# Patient Record
Sex: Female | Born: 1985 | Race: White | Hispanic: No | Marital: Married | Smoking: Never smoker
Health system: Southern US, Community
[De-identification: ages and names within clinical notes are randomized; demographics above are authoritative.]

## PROBLEM LIST (undated history)

## (undated) DIAGNOSIS — F419 Anxiety disorder, unspecified: Secondary | ICD-10-CM

## (undated) DIAGNOSIS — F99 Mental disorder, not otherwise specified: Secondary | ICD-10-CM

## (undated) DIAGNOSIS — R000 Tachycardia, unspecified: Secondary | ICD-10-CM

## (undated) DIAGNOSIS — K219 Gastro-esophageal reflux disease without esophagitis: Secondary | ICD-10-CM

## (undated) DIAGNOSIS — F410 Panic disorder [episodic paroxysmal anxiety] without agoraphobia: Secondary | ICD-10-CM

## (undated) HISTORY — DX: Panic disorder (episodic paroxysmal anxiety): F41.0

## (undated) HISTORY — DX: Mental disorder, not otherwise specified: F99

## (undated) HISTORY — DX: Tachycardia, unspecified: R00.0

## (undated) HISTORY — DX: Gastro-esophageal reflux disease without esophagitis: K21.9

## (undated) HISTORY — DX: Anxiety disorder, unspecified: F41.9

---
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
## 2005-12-25 ENCOUNTER — Other Ambulatory Visit: Admission: RE | Admit: 2005-12-25 | Discharge: 2005-12-25 | Payer: Self-pay | Admitting: Obstetrics and Gynecology

## 2006-04-15 ENCOUNTER — Emergency Department (HOSPITAL_COMMUNITY): Admission: EM | Admit: 2006-04-15 | Discharge: 2006-04-15 | Payer: Self-pay | Admitting: Emergency Medicine

## 2007-01-16 ENCOUNTER — Emergency Department (HOSPITAL_COMMUNITY): Admission: EM | Admit: 2007-01-16 | Discharge: 2007-01-16 | Payer: Self-pay | Admitting: Emergency Medicine

## 2007-06-06 IMAGING — CR DG CHEST 2V
2 series · 2 of 2 positions shown · non-contrast
Comparison: none

CLINICAL DATA: Chest pain and tachycardia.
 CHEST - 2 VIEWS ? 04/15/06:

[w chest pa]
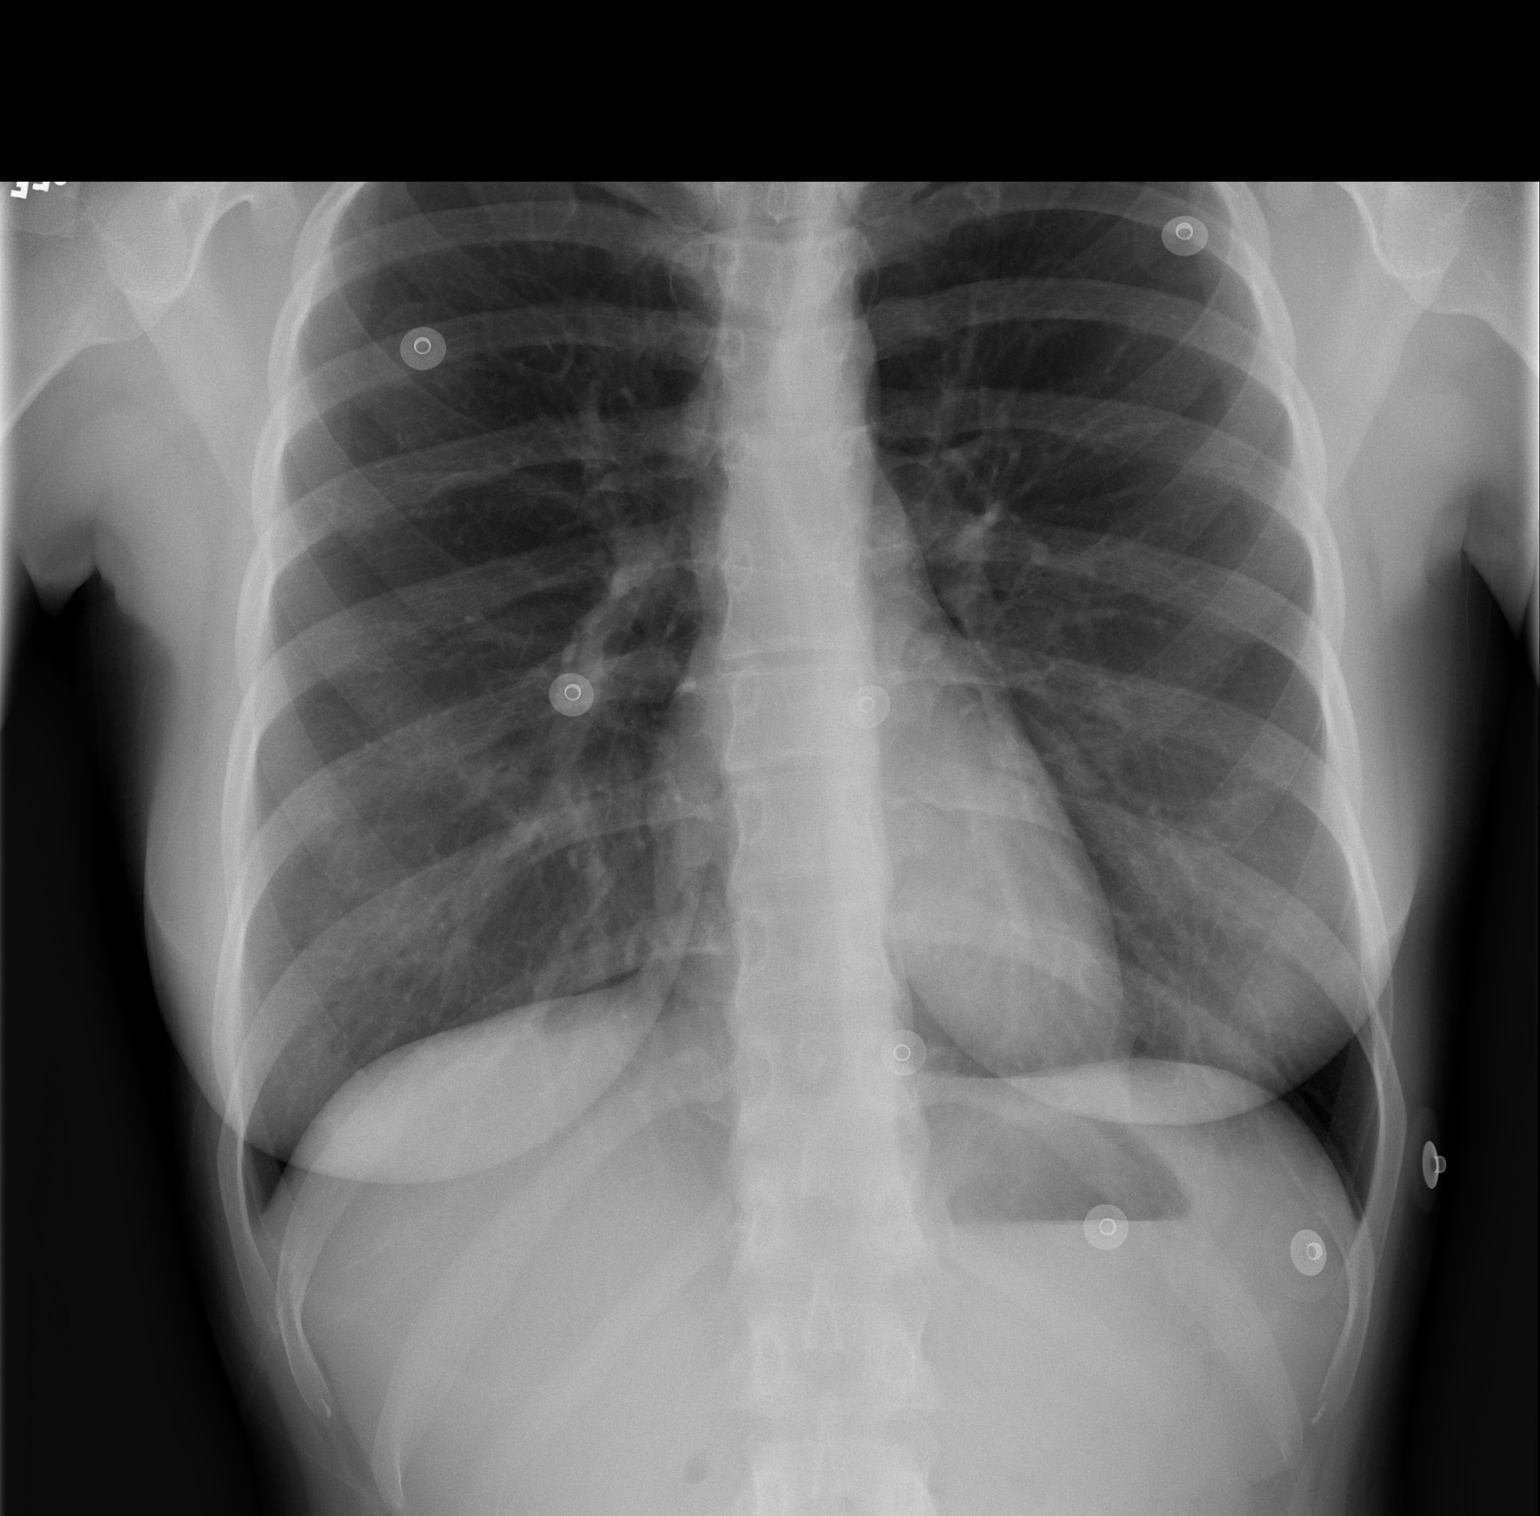

[w chest lat]
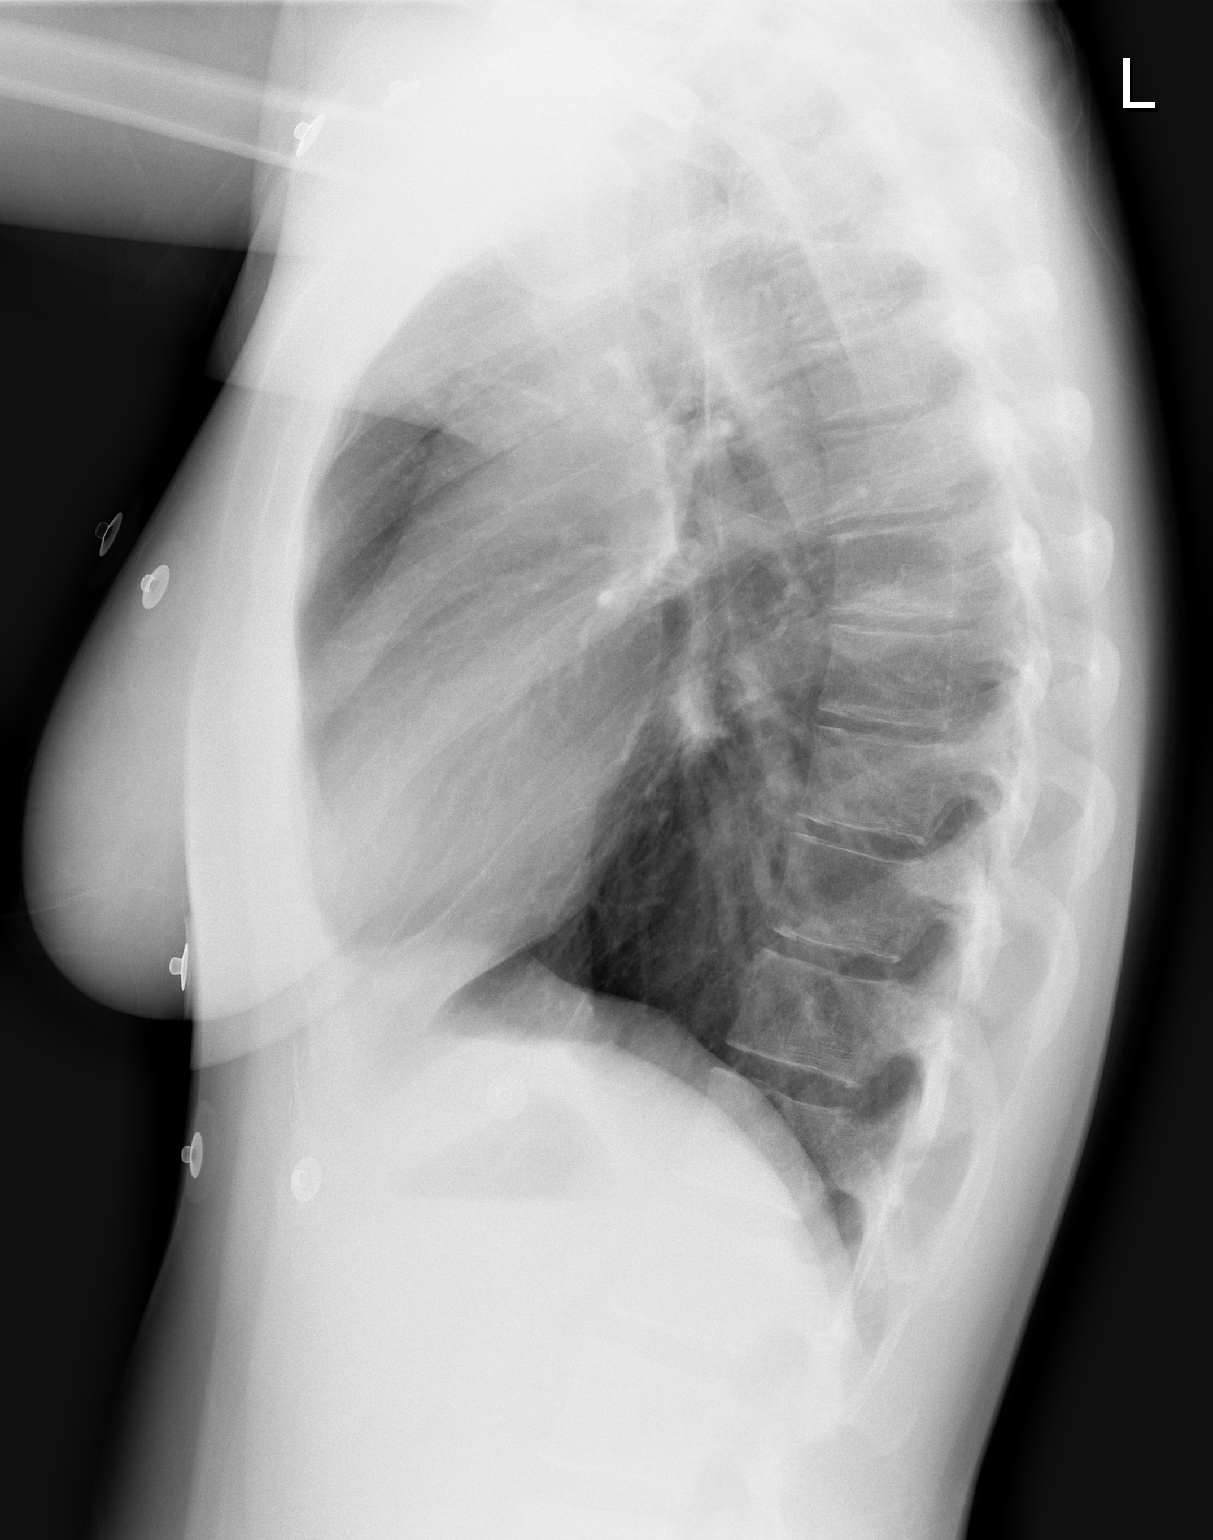

[2 of 2 positions shown; findings below may reference images not displayed]

FINDINGS: The heart size and mediastinal contours are within normal limits.  Both lungs are clear.  The visualized skeletal structures are within normal limits.
IMPRESSION: No active cardiopulmonary disease.

## 2007-07-05 ENCOUNTER — Emergency Department (HOSPITAL_COMMUNITY): Admission: EM | Admit: 2007-07-05 | Discharge: 2007-07-05 | Payer: Self-pay | Admitting: Emergency Medicine

## 2007-11-19 ENCOUNTER — Encounter: Admission: RE | Admit: 2007-11-19 | Discharge: 2007-11-19 | Payer: Self-pay | Admitting: Family Medicine

## 2008-03-08 IMAGING — CR DG CHEST 1V PORT
1 series · 1 of 1 positions shown · non-contrast
Comparison: 04/15/06.

CLINICAL DATA: 20-year-old with fever and throat closing.
 PORTABLE CHEST ? 1 VIEW ? 01/16/07:

[view not recorded]
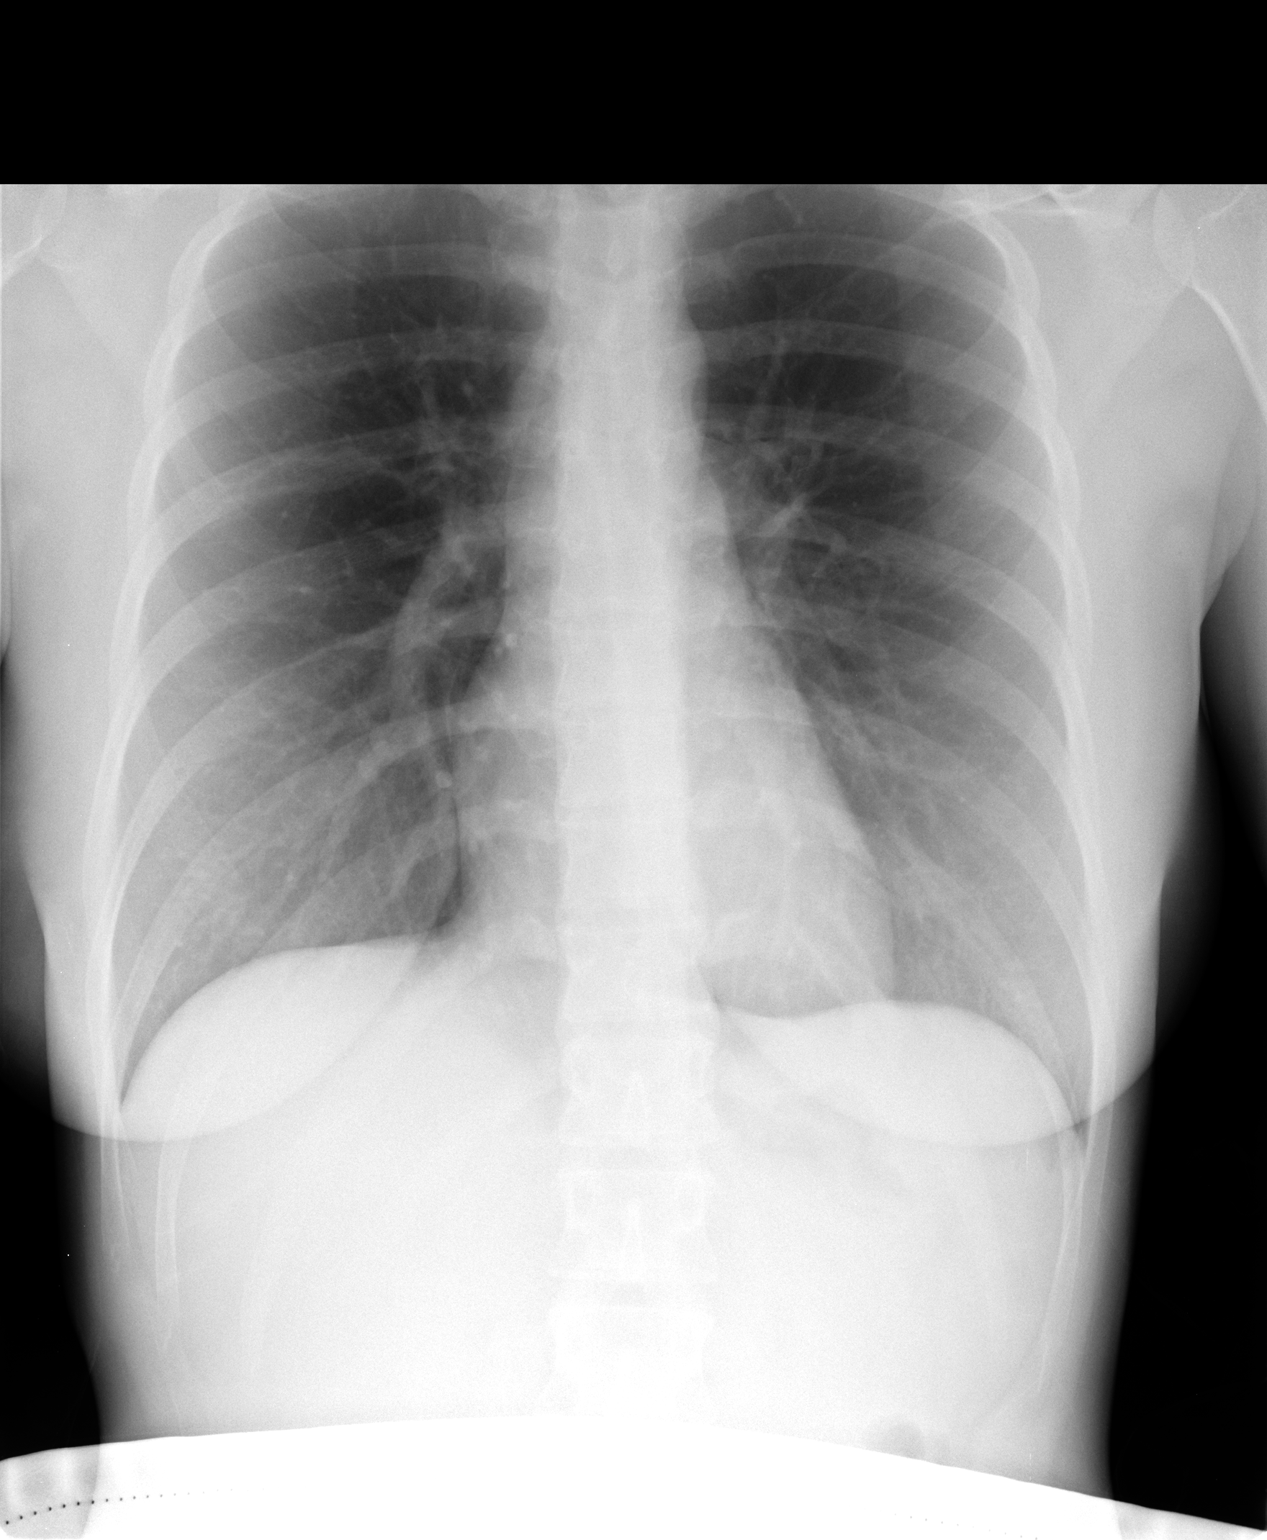

[1 of 1 positions shown; findings below may reference images not displayed]

FINDINGS: Single view of the chest demonstrates clear lungs.  Heart and mediastinum are stable.  Trachea is midline.  No acute bone abnormalities.
IMPRESSION: No acute chest findings.

## 2009-01-09 IMAGING — US US SOFT TISSUE HEAD/NECK
1 series · 14 of 25 positions shown · non-contrast
Comparison: Portable chest x-ray 01/16/07 [REDACTED].

CLINICAL DATA: Thyromegaly on physical exam. 
 THYROID ULTRASOUND:
TECHNIQUE: Ultrasound examination of the thyroid gland and adjacent soft tissue structures was performed.

[Series 1: us soft tissue head/neck · 0.07mm/px · 14 of 29 slices shown]
[im 1/29]
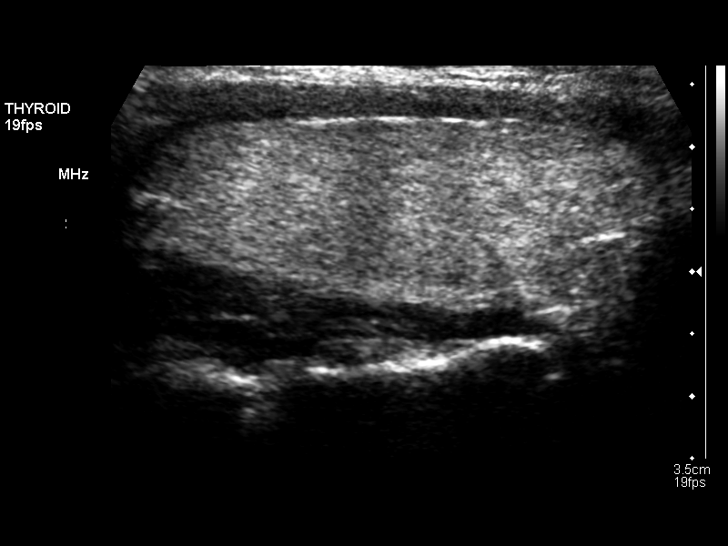
[im 3/29]
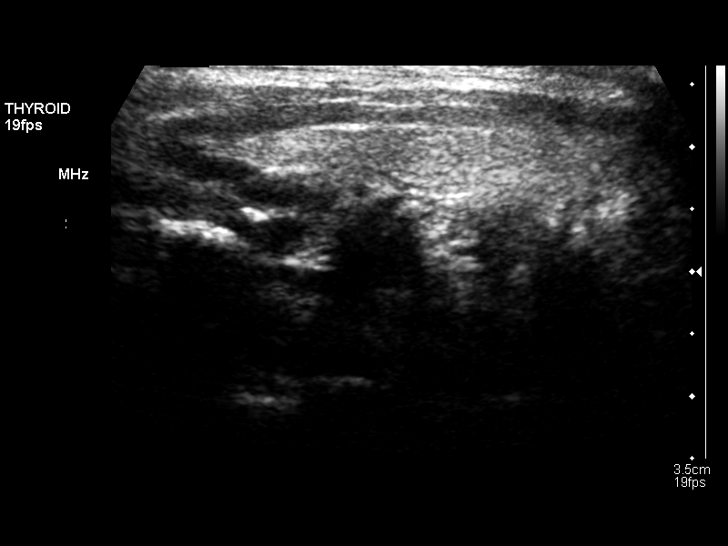
[im 5/29]
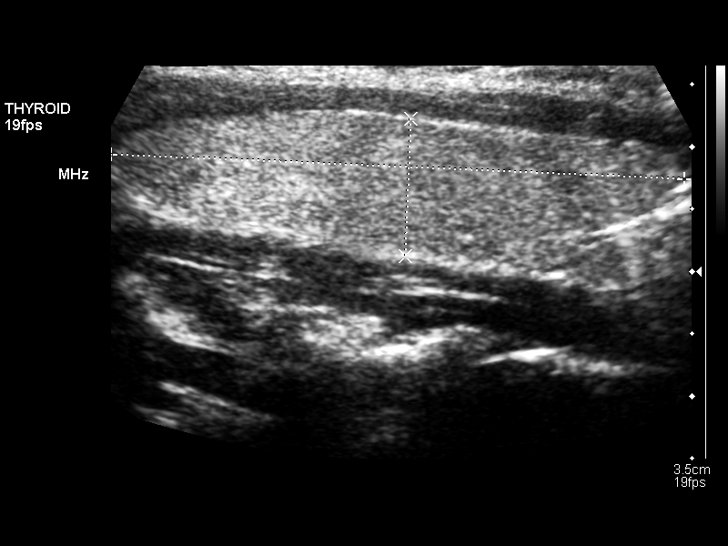
[im 8/29]
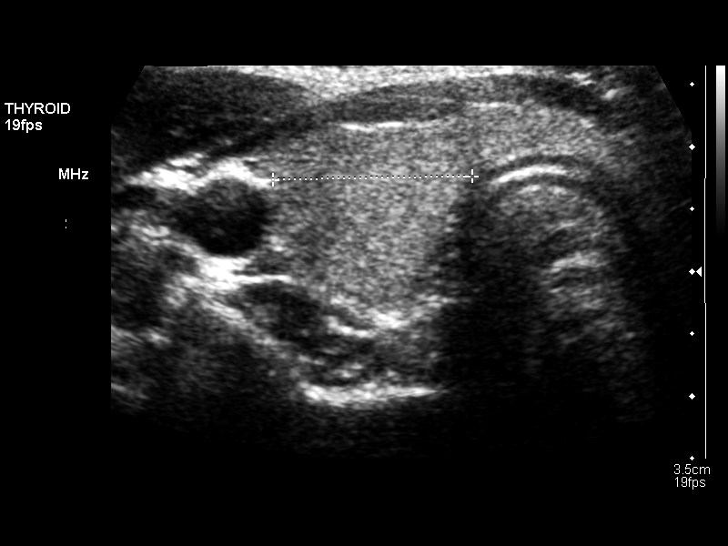
[im 10/29]
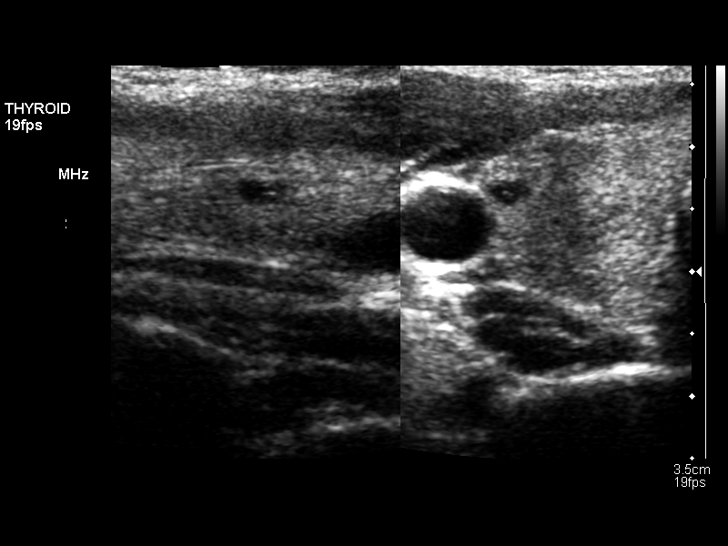
[im 11/29]
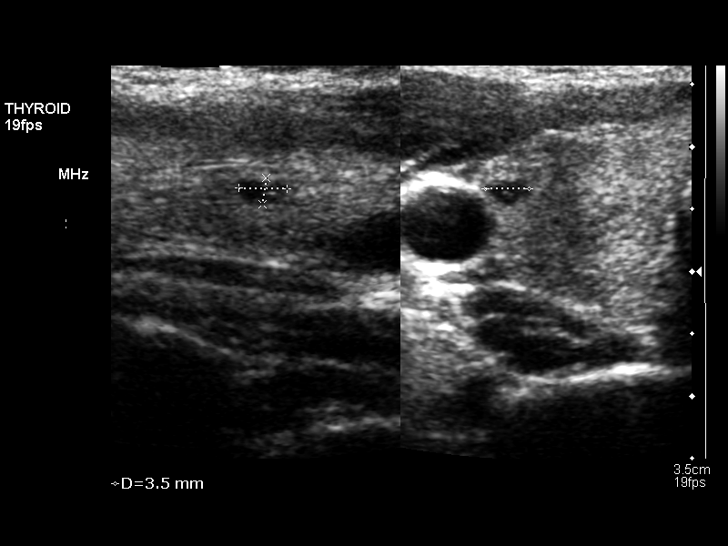
[im 13/29]
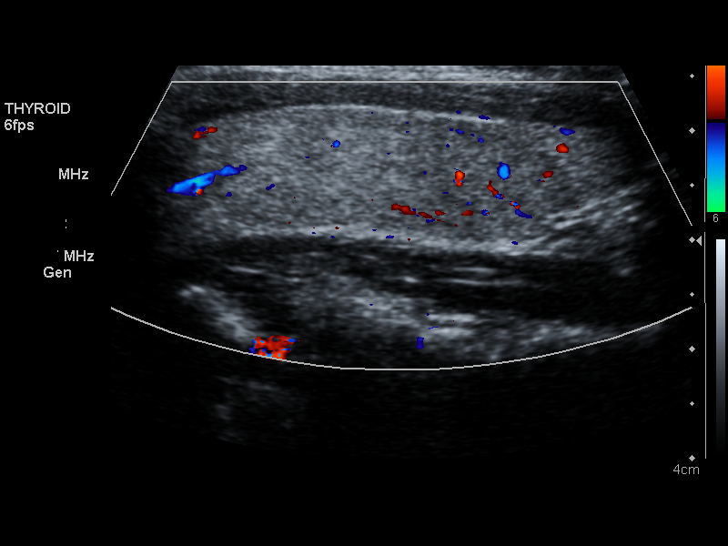
[im 16/29]
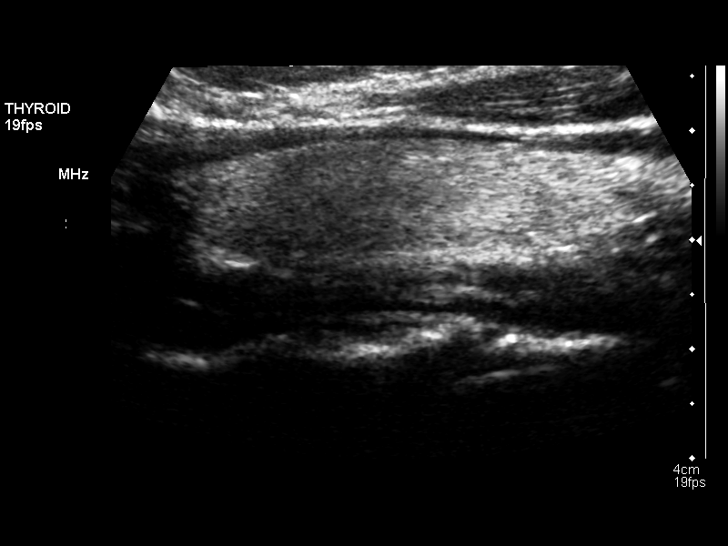
[im 18/29]
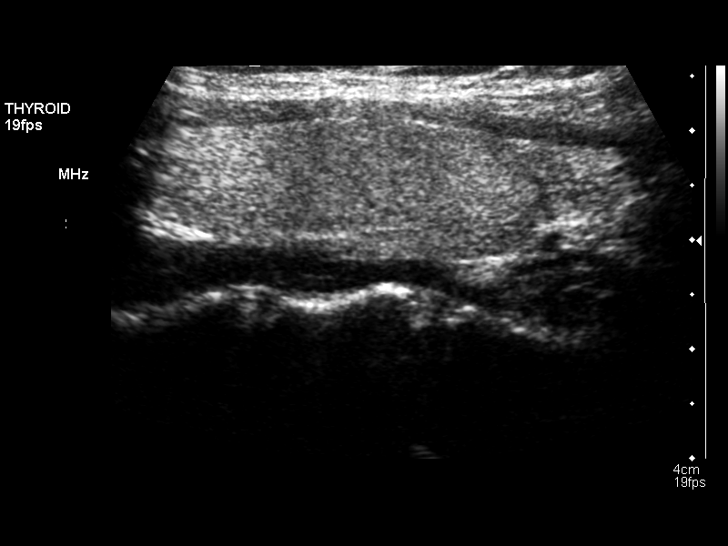
[im 19/29]
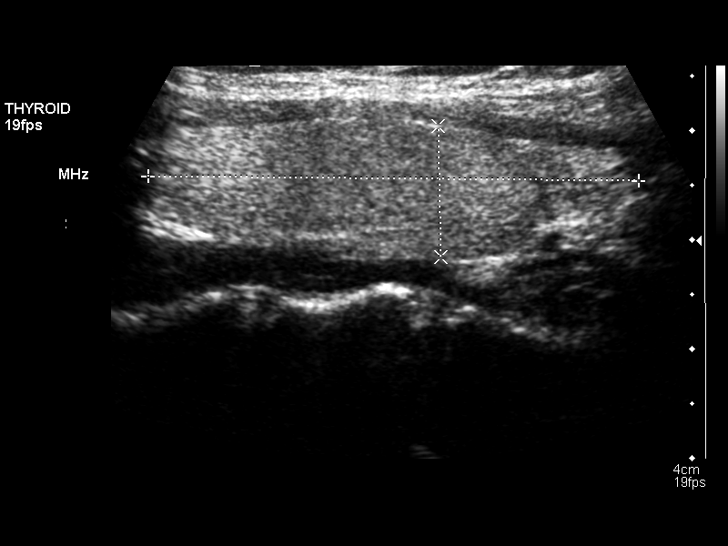
[im 22/29]
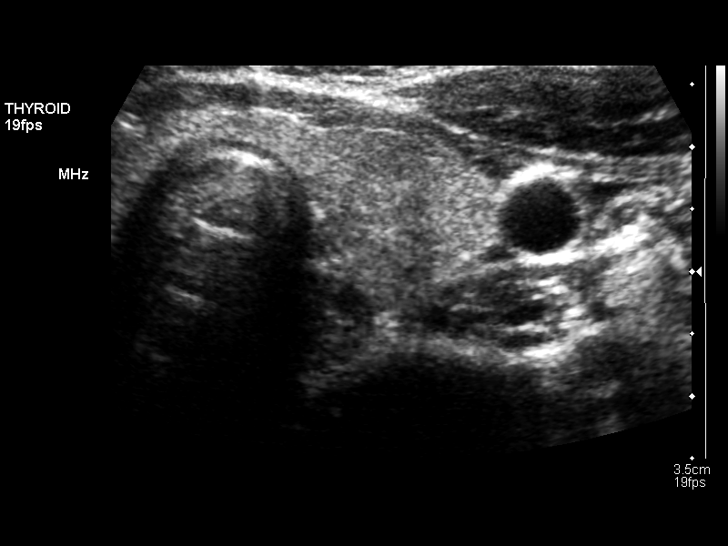
[im 24/29]
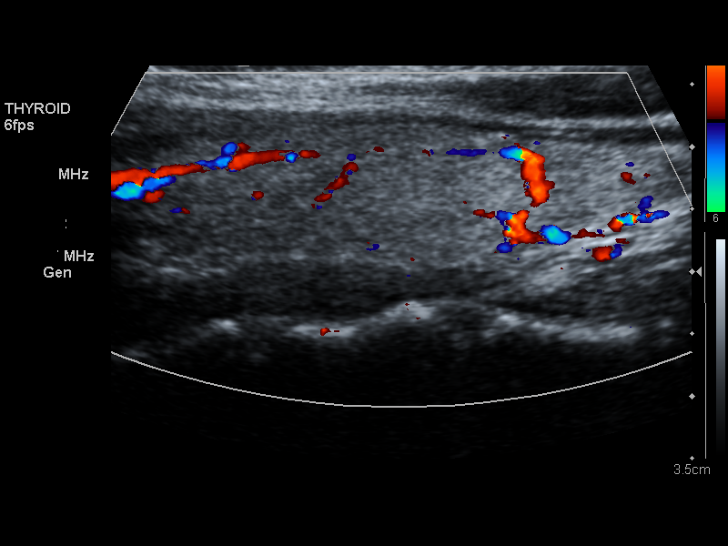
[im 26/29]
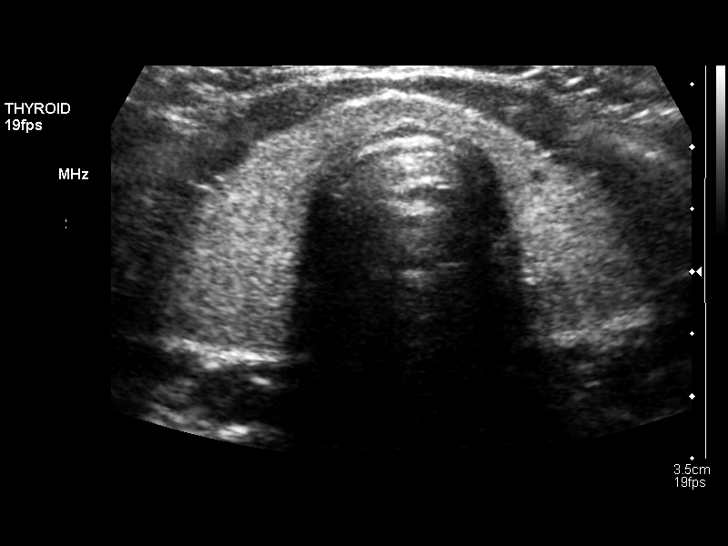
[im 29/29]
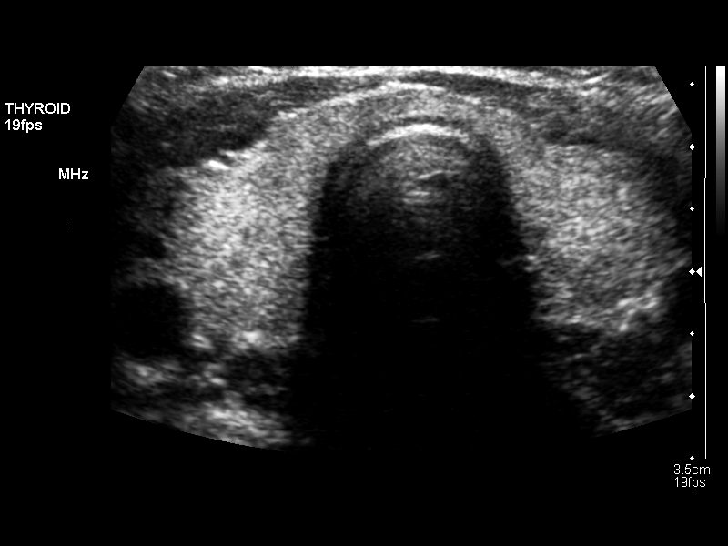

[14 of 25 positions shown; findings below may reference images not displayed]

FINDINGS: Thyroid gland is upper limits of normal in size with the right lobe measuring 4.6cm long x 1.1cm AP x 1.6cm wide and left lobe 4.8cm long x 1.4cm AP x 1.5cm wide.  The isthmus measures normally at 2mm AP thickness.  Thyroid echotexture is diffusely homogeneous.  At the lateral mid right lobe is solitary hypoechoic marginated nodule measuring 4mm long x 2mm AP x 4mm wide.
IMPRESSION: 1.  Likely benign solitary hypoechoic nodule such as adenoma lateral mid right lobe. 
 2.  Otherwise negative.

## 2010-12-22 ENCOUNTER — Encounter: Payer: Self-pay | Admitting: Family Medicine

## 2011-09-15 LAB — I-STAT 8, (EC8 V) (CONVERTED LAB)
BUN: 6
Chloride: 106
HCT: 44
Operator id: 272551
Potassium: 4
Sodium: 140
TCO2: 26
pH, Ven: 7.462 — ABNORMAL HIGH

## 2011-09-15 LAB — CBC
HCT: 40.8
Hemoglobin: 13.9
MCV: 87.9
RDW: 13
WBC: 8.3

## 2011-09-15 LAB — POCT CARDIAC MARKERS
Myoglobin, poc: 18
Operator id: 272551
Troponin i, poc: <0.05

## 2011-09-15 LAB — MAGNESIUM: Magnesium: 2

## 2011-09-15 LAB — DIFFERENTIAL
Eosinophils Absolute: 0
Monocytes Absolute: 0.7
Neutrophils Relative %: 71

## 2020-11-13 ENCOUNTER — Ambulatory Visit (INDEPENDENT_AMBULATORY_CARE_PROVIDER_SITE_OTHER): Payer: BC Managed Care – PPO | Admitting: Women's Health

## 2020-11-13 ENCOUNTER — Encounter: Payer: Self-pay | Admitting: Women's Health

## 2020-11-13 ENCOUNTER — Other Ambulatory Visit: Payer: Self-pay

## 2020-11-13 VITALS — BP 119/72 | HR 80 | Ht 65.0 in | Wt 183.8 lb

## 2020-11-13 DIAGNOSIS — Z30430 Encounter for insertion of intrauterine contraceptive device: Secondary | ICD-10-CM | POA: Insufficient documentation

## 2020-11-13 DIAGNOSIS — Z30432 Encounter for removal of intrauterine contraceptive device: Secondary | ICD-10-CM

## 2020-11-13 MED ORDER — LEVONORGESTREL 20 MCG/24HR IU IUD: INTRAUTERINE_SYSTEM | Freq: Once | INTRAUTERINE | Status: AC

## 2020-11-13 NOTE — Progress Notes (Signed)
   IUD REMOVAL & RE-INSERTION Patient name: Kerry Austin MRN [id number]  Date of birth: 1986/04/07 Subjective Findings:   @[place]  N Salak is a 34 y.o. G28P2002 Caucasian female being seen today for removal of a Mirena  IUD and insertion of a Mirena  IUD. Her IUD was placed Feb 2016 in another office.  This will be her 3rd Mirena.   Depression screen [facility] 2/9 11/13/2020  Decreased Interest 0  Down, Depressed, Hopeless 0  PHQ - 2 Score 0  Altered sleeping 1  Tired, decreased energy 0  Change in appetite 0  Feeling bad or failure about yourself  0  Trouble concentrating 0  Moving slowly or fidgety/restless 0  Suicidal thoughts 0  PHQ-9 Score 1    Patient's last menstrual period was 10/29/2020 (approximate). Last pap Nov 2020. Results were:  normal  The risks and benefits of the method and placement have been thouroughly reviewed with the patient and all questions were answered.  Specifically the patient is aware of failure rate of 12/998, expulsion of the IUD and of possible perforation.  The patient is aware of irregular bleeding due to the method and understands the incidence of irregular bleeding diminishes with time.  Signed copy of informed consent in chart.  Pertinent History Reviewed:   Reviewed past medical,surgical, social, obstetrical and family history.  Reviewed problem list, medications and allergies. Objective Findings & Procedure:    Vitals:   11/13/20 0834  BP: 119/72  Pulse: 80  Weight: 183 lb 12.8 oz (83.4 kg)  Height: 5\' 5"  (1.651 m)  Body mass index is 30.59 kg/m.  No results found for this or any previous visit (from the past 24 hour(s)).   Time out was performed.  A graves speculum was placed in the vagina.  The cervix was visualized, prepped using Betadine. The strings were visible. They were grasped and the Mirena IUD was easily removed. The cervix was then grasped with a single-tooth tenaculum. The uterus was found to be neutral and it sounded to 8 cm.   Mirena  IUD placed per manufacturer's recommendations without complications. The strings were trimmed to approximately 3 cm.  The patient tolerated the procedure well.   Informal transvaginal sonogram was performed and the proper placement of the IUD was verified.   Chaperone: 11/15/20 & Plan:   1) Mirena IUD removal & Mirena insertion The patient was given post procedure instructions, including signs and symptoms of infection and to check for the strings after each menses or each month, and refraining from intercourse or anything in the vagina for 3 days. She was given a care card with date IUD placed, and date IUD to be removed. She is scheduled for a f/u appointment in 4 weeks.  No orders of the defined types were placed in this encounter.   Follow-up: Return in about 4 weeks (around 12/11/2020) for IUD F/U, in person, CNM.  Art gallery manager CNM, [facility] 11/13/2020 9:13 AM

## 2020-11-13 NOTE — Patient Instructions (Signed)
 Nothing in vagina for 3 days (no sex, douching, tampons, etc...)  Check your strings once a month to make sure you can feel them, if you are not able to please let us know  If you develop a fever of 100.4 or more in the next few weeks, or if you develop severe abdominal pain, please let us know  Use a backup method of birth control, such as condoms, for 2 weeks     Intrauterine Device Insertion, Care After  This sheet gives you information about how to care for yourself after your procedure. Your health care provider may also give you more specific instructions. If you have problems or questions, contact your health care provider. What can I expect after the procedure? After the procedure, it is common to have:  Cramps and pain in the abdomen.  Light bleeding (spotting) or heavier bleeding that is like your menstrual period. This may last for up to a few days.  Lower back pain.  Dizziness.  Headaches.  Nausea. Follow these instructions at home:  Before resuming sexual activity, check to make sure that you can feel the IUD string(s). You should be able to feel the end of the string(s) below the opening of your cervix. If your IUD string is in place, you may resume sexual activity. ? If you had a hormonal IUD inserted more than 7 days after your most recent period started, you will need to use a backup method of birth control for 7 days after IUD insertion. Ask your health care provider whether this applies to you.  Continue to check that the IUD is still in place by feeling for the string(s) after every menstrual period, or once a month.  Take over-the-counter and prescription medicines only as told by your health care provider.  Do not drive or use heavy machinery while taking prescription pain medicine.  Keep all follow-up visits as told by your health care provider. This is important. Contact a health care provider if:  You have bleeding that is heavier or lasts longer  than a normal menstrual cycle.  You have a fever.  You have cramps or abdominal pain that get worse or do not get better with medicine.  You develop abdominal pain that is new or is not in the same area of earlier cramping and pain.  You feel lightheaded or weak.  You have abnormal or bad-smelling discharge from your vagina.  You have pain during sexual activity.  You have any of the following problems with your IUD string(s): ? The string bothers or hurts you or your sexual partner. ? You cannot feel the string. ? The string has gotten longer.  You can feel the IUD in your vagina.  You think you may be pregnant, or you miss your menstrual period.  You think you may have an STI (sexually transmitted infection). Get help right away if:  You have flu-like symptoms.  You have a fever and chills.  You can feel that your IUD has slipped out of place. Summary  After the procedure, it is common to have cramps and pain in the abdomen. It is also common to have light bleeding (spotting) or heavier bleeding that is like your menstrual period.  Continue to check that the IUD is still in place by feeling for the string(s) after every menstrual period, or once a month.  Keep all follow-up visits as told by your health care provider. This is important.  Contact your health care provider   if you have problems with your IUD string(s), such as the string getting longer or bothering you or your sexual partner. This information is not intended to replace advice given to you by your health care provider. Make sure you discuss any questions you have with your health care provider. Document Revised: 10/30/2017 Document Reviewed: 10/08/2016 Elsevier Patient Education  2020 Elsevier Inc.  

## 2020-11-22 DIAGNOSIS — U071 COVID-19: Secondary | ICD-10-CM | POA: Diagnosis not present

## 2020-12-06 DIAGNOSIS — U099 Post covid-19 condition, unspecified: Secondary | ICD-10-CM | POA: Diagnosis not present

## 2021-03-04 DIAGNOSIS — J301 Allergic rhinitis due to pollen: Secondary | ICD-10-CM | POA: Diagnosis not present

## 2021-03-04 DIAGNOSIS — K219 Gastro-esophageal reflux disease without esophagitis: Secondary | ICD-10-CM | POA: Diagnosis not present

## 2021-03-04 DIAGNOSIS — J019 Acute sinusitis, unspecified: Secondary | ICD-10-CM | POA: Diagnosis not present

## 2021-03-04 DIAGNOSIS — Z681 Body mass index (BMI) 19 or less, adult: Secondary | ICD-10-CM | POA: Diagnosis not present

## 2021-10-13 ENCOUNTER — Emergency Department (HOSPITAL_COMMUNITY)
Admission: EM | Admit: 2021-10-13 | Discharge: 2021-10-13 | Disposition: A | Discharge status: Home / Self Care | Payer: BC Managed Care – PPO | Attending: Emergency Medicine | Admitting: Emergency Medicine

## 2021-10-13 ENCOUNTER — Encounter (HOSPITAL_COMMUNITY): Payer: Self-pay

## 2021-10-13 ENCOUNTER — Other Ambulatory Visit: Payer: Self-pay

## 2021-10-13 DIAGNOSIS — T2020XA Burn of second degree of head, face, and neck, unspecified site, initial encounter: Secondary | ICD-10-CM | POA: Insufficient documentation

## 2021-10-13 DIAGNOSIS — S0501XA Injury of conjunctiva and corneal abrasion without foreign body, right eye, initial encounter: Secondary | ICD-10-CM | POA: Insufficient documentation

## 2021-10-13 DIAGNOSIS — T23261A Burn of second degree of back of right hand, initial encounter: Secondary | ICD-10-CM

## 2021-10-13 DIAGNOSIS — T23201A Burn of second degree of right hand, unspecified site, initial encounter: Secondary | ICD-10-CM | POA: Insufficient documentation

## 2021-10-13 DIAGNOSIS — X088XXA Exposure to other specified smoke, fire and flames, initial encounter: Secondary | ICD-10-CM | POA: Insufficient documentation

## 2021-10-13 MED ORDER — TETRACAINE HCL 0.5 % OP SOLN
1.0000 [drp] | Freq: Once | OPHTHALMIC | Status: AC
  Administered 2021-10-13: 1 [drp] via OPHTHALMIC
  Filled 2021-10-13: qty 4

## 2021-10-13 MED ORDER — ERYTHROMYCIN 5 MG/GM OP OINT
TOPICAL_OINTMENT | Freq: Once | OPHTHALMIC | Status: AC
  Filled 2021-10-13: qty 3.5

## 2021-10-13 MED ORDER — HYDROMORPHONE HCL 1 MG/ML IJ SOLN
1.0000 mg | Freq: Once | INTRAMUSCULAR | Status: AC
  Administered 2021-10-13: 1 mg via INTRAMUSCULAR
  Filled 2021-10-13: qty 1

## 2021-10-13 MED ORDER — ONDANSETRON 4 MG PO TBDP: 4.0000 mg | ORAL_TABLET | Freq: Three times a day (TID) | ORAL | 0 refills | Status: DC | PRN

## 2021-10-13 MED ORDER — BACITRACIN ZINC 500 UNIT/GM EX OINT
1.0000 "application " | TOPICAL_OINTMENT | Freq: Two times a day (BID) | CUTANEOUS | Status: DC
  Administered 2021-10-13: 1 via TOPICAL
  Filled 2021-10-13: qty 4.5

## 2021-10-13 MED ORDER — OXYCODONE-ACETAMINOPHEN 5-325 MG PO TABS: 1.0000 | ORAL_TABLET | Freq: Four times a day (QID) | ORAL | 0 refills | Status: DC | PRN

## 2021-10-13 MED ORDER — FLUORESCEIN SODIUM 1 MG OP STRP
1.0000 | ORAL_STRIP | Freq: Once | OPHTHALMIC | Status: AC
  Administered 2021-10-13: 1 via OPHTHALMIC
  Filled 2021-10-13: qty 1
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
## 2022-05-11 ENCOUNTER — Ambulatory Visit: Payer: Self-pay

## 2022-06-09 ENCOUNTER — Ambulatory Visit
Admission: RE | Admit: 2022-06-09 | Discharge: 2022-06-09 | Disposition: A | Discharge status: Home / Self Care | Payer: Self-pay | Source: Ambulatory Visit | Attending: Urgent Care | Admitting: Urgent Care

## 2022-06-09 VITALS — BP 108/73 | HR 81 | Temp 98.3°F | Resp 18

## 2022-06-09 DIAGNOSIS — J309 Allergic rhinitis, unspecified: Secondary | ICD-10-CM

## 2022-06-09 DIAGNOSIS — U071 COVID-19: Secondary | ICD-10-CM

## 2022-06-09 DIAGNOSIS — J329 Chronic sinusitis, unspecified: Secondary | ICD-10-CM

## 2022-06-09 MED ORDER — ONDANSETRON 8 MG PO TBDP: 8.0000 mg | ORAL_TABLET | Freq: Three times a day (TID) | ORAL | 0 refills | Status: DC | PRN

## 2022-06-09 MED ORDER — PREDNISONE 50 MG PO TABS: 50.0000 mg | ORAL_TABLET | Freq: Every day | ORAL | 0 refills | Status: DC

## 2022-06-09 MED ORDER — AMOXICILLIN 875 MG PO TABS: 875.0000 mg | ORAL_TABLET | Freq: Two times a day (BID) | ORAL | 0 refills | Status: DC

## 2022-06-09 MED ORDER — PROMETHAZINE-DM 6.25-15 MG/5ML PO SYRP: 5.0000 mL | ORAL_SOLUTION | Freq: Three times a day (TID) | ORAL | 0 refills | Status: DC | PRN

## 2022-06-09 NOTE — ED Triage Notes (Signed)
Pt presents with c/o nasal congestion and feeling unwell for past 5 days, has had h/o chronic sinusitis but needs pcr test for work , possible home test positive

## 2022-06-09 NOTE — ED Provider Notes (Signed)
[facility]   MRN: [id number] DOB: 1986/04/15  Subjective:   Kerry Austin is a 36 y.o. female presenting for 5-day history of acute onset recurrent malaise, sinus congestion, sinus pressure, sinus pain.  Patient took a COVID test at home and was positive.  They require that she take a PCR test before she can return to work.  She has longstanding history of sinus issues.  Has previously been on Xyzal and recently switched to Zyrtec.  About 2 weeks ago she was treated for recurrent sinusitis and was prescribed steroids and Augmentin.  She cannot tolerate the Augmentin at all and took just a few doses before she switched to azithromycin.  She did finish out the prednisone.  She had some improvement but her symptoms never resolved much at all.  No chest pain, shortness of breath or wheezing.  Patient is not a smoker.  No vaping.  No current facility-administered medications for this encounter.  Current Outpatient Medications:    ALPRAZolam (XANAX) 0.5 MG tablet, Take 0.5 mg by mouth at bedtime as needed for anxiety., Disp: , Rfl:    citalopram (CELEXA) 40 MG tablet, Take 40 mg by mouth daily., Disp: , Rfl:    metoprolol succinate (TOPROL-XL) 25 MG 24 hr tablet, Take 25 mg by mouth daily as needed., Disp: , Rfl:    omeprazole (PRILOSEC) 40 MG capsule, Take 40 mg by mouth daily., Disp: , Rfl:    No Known Allergies  Past Medical History:  Diagnosis Date   Anxiety    GERD (gastroesophageal reflux disease)    Mental disorder    Panic attacks    Racing heart beat      History reviewed. No pertinent surgical history.  Family History  Problem Relation Age of Onset   Heart disease Maternal Grandfather    Diabetes Father    Fibromyalgia Mother     Social History   Tobacco Use   Smoking status: Never   Smokeless tobacco: Never  Vaping Use   Vaping Use: Never used  Substance Use Topics   Alcohol use: Yes    Alcohol/week: 1.0 standard drink of alcohol    Types: 1  Glasses of wine per week    Comment: couple times weekly   Drug use: Never    ROS   Objective:   Vitals: BP 108/73   Pulse 81   Temp 98.3 F (36.8 C)   Resp 18   SpO2 96%   Physical Exam Constitutional:      General: She is not in acute distress.    Appearance: Normal appearance. She is well-developed and normal weight. She is not ill-appearing, toxic-appearing or diaphoretic.  HENT:     Head: Normocephalic and atraumatic.     Right Ear: Tympanic membrane, ear canal and external ear normal. No drainage or tenderness. No middle ear effusion. There is no impacted cerumen. Tympanic membrane is not erythematous.     Left Ear: Tympanic membrane, ear canal and external ear normal. No drainage or tenderness.  No middle ear effusion. There is no impacted cerumen. Tympanic membrane is not erythematous.     Nose: Congestion present. No rhinorrhea.     Mouth/Throat:     Mouth: Mucous membranes are moist. No oral lesions.     Pharynx: No pharyngeal swelling, oropharyngeal exudate, posterior oropharyngeal erythema or uvula swelling.     Tonsils: No tonsillar exudate or tonsillar abscesses.  Eyes:     General: No scleral icterus.  Right eye: No discharge.        Left eye: No discharge.     Extraocular Movements: Extraocular movements intact.     Right eye: Normal extraocular motion.     Left eye: Normal extraocular motion.     Conjunctiva/sclera: Conjunctivae normal.  Cardiovascular:     Rate and Rhythm: Normal rate and regular rhythm.     Heart sounds: Normal heart sounds. No murmur heard.    No friction rub. No gallop.  Pulmonary:     Effort: Pulmonary effort is normal. No respiratory distress.     Breath sounds: No stridor. No wheezing, rhonchi or rales.  Chest:     Chest wall: No tenderness.  Musculoskeletal:     Cervical back: Normal range of motion and neck supple.  Lymphadenopathy:     Cervical: No cervical adenopathy.  Skin:    General: Skin is warm and dry.   Neurological:     General: No focal deficit present.     Mental Status: She is alert and oriented to person, place, and time.  Psychiatric:        Mood and Affect: Mood normal.        Behavior: Behavior normal.     Assessment and Plan :   PDMP not reviewed this encounter.  1. COVID-19   2. Allergic rhinitis, unspecified seasonality, unspecified trigger   3. Chronic sinusitis, unspecified location    COVID confirmation test is pending.  As patient took azithromycin I counseled that she really did not treat her sinus infection.  I offered to switch her from Augmentin to amoxicillin as she was unable to tolerate the Augmentin.  Offered her Zofran as well to help with GI symptoms/side effects.  Recommended she continue taking Zyrtec daily.  She also would benefit from doing a prednisone course given her longstanding history of chronic allergic sinusitis.  Patient is planning on following up with her PCP and obtaining a referral to ENT. Counseled patient on potential for adverse effects with medications prescribed/recommended today, ER and return-to-clinic precautions discussed, patient verbalized understanding.    Wallis Bamberg, PA-C 06/09/22 1415

## 2022-06-10 ENCOUNTER — Ambulatory Visit: Payer: Self-pay

## 2022-06-10 LAB — NOVEL CORONAVIRUS, NAA: SARS-CoV-2, NAA: NOT DETECTED

## 2022-10-01 ENCOUNTER — Inpatient Hospital Stay: Admission: RE | Admit: 2022-10-01 | Payer: Self-pay | Source: Ambulatory Visit

## 2022-10-01 ENCOUNTER — Ambulatory Visit
Admission: EM | Admit: 2022-10-01 | Discharge: 2022-10-01 | Disposition: A | Discharge status: Home / Self Care | Payer: Self-pay

## 2022-10-01 DIAGNOSIS — Z20828 Contact with and (suspected) exposure to other viral communicable diseases: Secondary | ICD-10-CM | POA: Insufficient documentation

## 2022-10-01 DIAGNOSIS — Z1152 Encounter for screening for COVID-19: Secondary | ICD-10-CM | POA: Insufficient documentation

## 2022-10-01 DIAGNOSIS — J069 Acute upper respiratory infection, unspecified: Secondary | ICD-10-CM | POA: Insufficient documentation

## 2022-10-01 LAB — RESP PANEL BY RT-PCR (FLU A&B, COVID) ARPGX2
Influenza A by PCR: NEGATIVE
Influenza B by PCR: NEGATIVE
SARS Coronavirus 2 by RT PCR: NEGATIVE

## 2022-10-01 LAB — POCT RAPID STREP A (OFFICE): Rapid Strep A Screen: NEGATIVE

## 2022-10-01 MED ORDER — BENZONATATE 100 MG PO CAPS: 100.0000 mg | ORAL_CAPSULE | Freq: Three times a day (TID) | ORAL | 0 refills | Status: DC | PRN

## 2022-10-01 MED ORDER — PROMETHAZINE-DM 6.25-15 MG/5ML PO SYRP: 5.0000 mL | ORAL_SOLUTION | Freq: Every evening | ORAL | 0 refills | Status: DC | PRN

## 2022-10-01 NOTE — ED Triage Notes (Signed)
Pt reports headache, cough, sore throat x 2 days. OTC cough meds gives no relief.

## 2022-10-01 NOTE — Discharge Instructions (Signed)
You have a viral upper respiratory infection.  Symptoms should improve over the next week to 10 days.  If you develop chest pain or shortness of breath, go to the emergency room.  We have tested you today for COVID-19 and influenza.  You will see the results in Mychart and we will call you with positive results.  We will prescribe Tamiflu or Paxlovid if you test positive for either COVID-19 or influenza.  Please stay home and isolate until you are aware of the results.    Some things that can make you feel better are: - Increased rest - Increasing fluid with water/sugar free electrolytes - Acetaminophen and ibuprofen as needed for fever/pain - Salt water gargling, chloraseptic spray and throat lozenges - OTC guaifenesin (Mucinex) 600 mg twice daily - Saline sinus flushes or a neti pot - Humidifying the air -Tessalon Perles during the day as needed for dry cough and cough syrup at nighttime as needed for dry cough

## 2022-10-01 NOTE — ED Provider Notes (Signed)
[facility]    CSN: [id number] Arrival date & time: 10/01/22  1414      History   Chief Complaint Chief Complaint  Patient presents with   Cough   Sore Throat         HPI Kerry Austin is a 36 y.o. female.   Patient presents for 2 days of body aches, dry cough, nasal congestion, postnasal drainage, sneezing, sore throat, headache, bilateral ear pressure, nausea without vomiting, decreased appetite, and fatigue.  She denies known fevers, shortness of breath, chest pain, abdominal pain, diarrhea, loss of taste or smell, and new rash.  Reports she has been around multiple people who have had the flu, but is also requesting COVID-19 testing.  She has taken Tylenol Cold and flu for symptoms which helps temporarily.  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     Past Medical History:  Diagnosis Date   Anxiety    GERD (gastroesophageal reflux disease)    Mental disorder    Panic attacks    Racing heart beat     Patient Active Problem List   Diagnosis Date Noted   Encounter for IUD insertion 11/13/2020    History reviewed. No pertinent surgical history.  OB History     Gravida  2   Para  2   Term  2   Preterm  0   AB  0   Living  2      SAB  0   IAB  0   Ectopic  0   Multiple  0   Live Births  2            Home Medications    Prior to Admission medications   Medication Sig Start Date End Date Taking? Authorizing Provider  benzonatate (TESSALON) 100 MG capsule Take 1 capsule (100 mg total) by mouth 3 (three) times daily as needed for cough. Do not take with alcohol or while driving or operating heavy machinery.  May cause drowsiness. 10/01/22  Yes Valentino Nose, NP  levonorgestrel (MIRENA) 20 MCG/DAY IUD 1 each by Intrauterine route once.   Yes [provider]  ALPRAZolam Prudy Feeler) 0.5 MG tablet Take 0.5 mg by mouth at bedtime as needed  for anxiety.    [provider]  citalopram (CELEXA) 40 MG tablet Take 40 mg by mouth daily.    [provider]  metoprolol succinate (TOPROL-XL) 25 MG 24 hr tablet Take 25 mg by mouth daily as needed.    [provider]  omeprazole (PRILOSEC) 40 MG capsule Take 40 mg by mouth daily.    [provider]  ondansetron (ZOFRAN-ODT) 8 MG disintegrating tablet Take 1 tablet (8 mg total) by mouth every 8 (eight) hours as needed for nausea or vomiting. 06/09/22   Wallis Bamberg, PA-C  promethazine-dextromethorphan (PROMETHAZINE-DM) 6.25-15 MG/5ML syrup Take 5 mLs by mouth at bedtime as needed for cough. Do not take with alcohol or while driving or operating heavy machinery.  May cause drowsiness. 10/01/22   Valentino Nose, NP    Family History Family History  Problem Relation Age of Onset   Heart disease Maternal Grandfather    Diabetes Father    Fibromyalgia Mother     Social History Social History   Tobacco Use   Smoking status: Never   Smokeless tobacco: Never  Vaping Use   Vaping Use: Never used  Substance Use  Topics   Alcohol use: Yes    Alcohol/week: 1.0 standard drink of alcohol    Types: 1 Glasses of wine per week    Comment: couple times weekly   Drug use: Never     Allergies   Patient has no known allergies.   Review of Systems Review of Systems Per HPI  Physical Exam Triage Vital Signs ED Triage Vitals  Enc Vitals Group     BP 10/01/22 1449 114/73     Pulse Rate 10/01/22 1449 84     Resp 10/01/22 1449 16     Temp 10/01/22 1449 98.2 F (36.8 C)     Temp Source 10/01/22 1449 Oral     SpO2 10/01/22 1449 97 %     Weight --      Height --      Head Circumference --      Peak Flow --      Pain Score 10/01/22 1447 7     Pain Loc --      Pain Edu? --      Excl. in [place]? --    No data found.  Updated Vital Signs BP 114/73 (BP Location: Right Arm)   Pulse 84   Temp 98.2 F (36.8 C) (Oral)   Resp 16   SpO2 97%   Visual  Acuity Right Eye Distance:   Left Eye Distance:   Bilateral Distance:    Right Eye Near:   Left Eye Near:    Bilateral Near:     Physical Exam Vitals and nursing note reviewed.  Constitutional:      General: She is not in acute distress.    Appearance: Normal appearance. She is not ill-appearing or toxic-appearing.  HENT:     Head: Normocephalic and atraumatic.     Right Ear: Tympanic membrane, ear canal and external ear normal. No drainage, swelling or tenderness. No middle ear effusion. Tympanic membrane is not erythematous.     Left Ear: Tympanic membrane, ear canal and external ear normal. No drainage, swelling or tenderness.  No middle ear effusion. Tympanic membrane is not erythematous.     Nose: Congestion and rhinorrhea present.     Mouth/Throat:     Mouth: Mucous membranes are moist.     Pharynx: Oropharynx is clear. No oropharyngeal exudate or posterior oropharyngeal erythema.     Tonsils: No tonsillar exudate.  Eyes:     General: No scleral icterus.    Extraocular Movements: Extraocular movements intact.  Cardiovascular:     Rate and Rhythm: Normal rate and regular rhythm.  Pulmonary:     Effort: Pulmonary effort is normal. No respiratory distress.     Breath sounds: Normal breath sounds. No wheezing, rhonchi or rales.  Abdominal:     General: Abdomen is flat. Bowel sounds are normal. There is no distension.     Palpations: Abdomen is soft.     Tenderness: There is no abdominal tenderness. There is no guarding.  Musculoskeletal:     Cervical back: Normal range of motion and neck supple.  Lymphadenopathy:     Cervical: Cervical adenopathy present.  Skin:    General: Skin is warm and dry.     Coloration: Skin is not jaundiced or pale.     Findings: No erythema or rash.  Neurological:     Mental Status: She is alert and oriented to person, place, and time.     Motor: No weakness.  Psychiatric:        Behavior: Behavior  is cooperative.      UC Treatments /  Results  Labs (all labs ordered are listed, but only abnormal results are displayed) Labs Reviewed  RESP PANEL BY RT-PCR (FLU A&B, COVID) [id number]  POCT RAPID STREP A (OFFICE)    EKG   Radiology No results found.  Procedures Procedures (including critical care time)  Medications Ordered in UC Medications - No data to display  Initial Impression / Assessment and Plan / UC Course  I have reviewed the triage vital signs and the nursing notes.  Pertinent labs & imaging results that were available during my care of the patient were reviewed by me and considered in my medical decision making (see chart for details).   Patient is well-appearing, normotensive, afebrile, not tachycardic, not tachypneic, oxygenating well on room air.  Encounter for screening for COVID-19 Viral URI with cough Exposure to the flu Suspect viral etiology Rapid strep throat test negative, throat culture deferred given examination and history COVID-19, influenza testing obtained Patient be a candidate for Tamiflu or Paxlovid if she tests positive Supportive care discussed and encouraged Start cough suppressant Note given for work ER and return precautions discussed  The patient was given the opportunity to ask questions.  All questions answered to their satisfaction.  The patient is in agreement to this plan.  Final Clinical Impressions(s) / UC Diagnoses   Final diagnoses:  Encounter for screening for COVID-19  Viral URI with cough  Exposure to the flu     Discharge Instructions      You have a viral upper respiratory infection.  Symptoms should improve over the next week to 10 days.  If you develop chest pain or shortness of breath, go to the emergency room.  We have tested you today for COVID-19 and influenza.  You will see the results in Mychart and we will call you with positive results.  We will prescribe Tamiflu or Paxlovid if you test positive for either COVID-19 or influenza.  Please stay  home and isolate until you are aware of the results.    Some things that can make you feel better are: - Increased rest - Increasing fluid with water/sugar free electrolytes - Acetaminophen and ibuprofen as needed for fever/pain - Salt water gargling, chloraseptic spray and throat lozenges - OTC guaifenesin (Mucinex) 600 mg twice daily - Saline sinus flushes or a neti pot - Humidifying the air -Tessalon Perles during the day as needed for dry cough and cough syrup at nighttime as needed for dry cough     ED Prescriptions     Medication Sig Dispense Auth. Provider   promethazine-dextromethorphan (PROMETHAZINE-DM) 6.25-15 MG/5ML syrup Take 5 mLs by mouth at bedtime as needed for cough. Do not take with alcohol or while driving or operating heavy machinery.  May cause drowsiness. 118 mL Cathlean Marseilles A, NP   benzonatate (TESSALON) 100 MG capsule Take 1 capsule (100 mg total) by mouth 3 (three) times daily as needed for cough. Do not take with alcohol or while driving or operating heavy machinery.  May cause drowsiness. 21 capsule Valentino Nose, NP      PDMP not reviewed this encounter.   Valentino Nose, NP 10/01/22 1510

## 2022-12-18 ENCOUNTER — Ambulatory Visit
Admission: RE | Admit: 2022-12-18 | Discharge: 2022-12-18 | Disposition: A | Discharge status: Home / Self Care | Payer: Self-pay | Source: Ambulatory Visit | Attending: Family Medicine | Admitting: Family Medicine

## 2022-12-18 VITALS — BP 108/75 | HR 80 | Temp 98.1°F | Resp 18

## 2022-12-18 DIAGNOSIS — Z1152 Encounter for screening for COVID-19: Secondary | ICD-10-CM | POA: Insufficient documentation

## 2022-12-18 DIAGNOSIS — J0100 Acute maxillary sinusitis, unspecified: Secondary | ICD-10-CM | POA: Insufficient documentation

## 2022-12-18 MED ORDER — AMOXICILLIN-POT CLAVULANATE 875-125 MG PO TABS: 1.0000 | ORAL_TABLET | Freq: Two times a day (BID) | ORAL | 0 refills | Status: DC

## 2022-12-18 NOTE — ED Provider Notes (Signed)
[facility]    CSN: [id number] Arrival date & time: 12/18/22  1203      History   Chief Complaint Chief Complaint  Patient presents with   Cough    Lots of nasal congestion and headache - Entered by patient    HPI Kerry Austin is a 37 y.o. female.   Presenting today with 2-week history of progressively worsening nasal congestion, facial pain and pressure, malaise, headache.  Denies chest pain, shortness of breath, abdominal pain, nausea vomiting or diarrhea.  Taking numerous over-the-counter cold and congestion medications with minimal relief.  No known sick contacts recently.    Past Medical History:  Diagnosis Date   Anxiety    GERD (gastroesophageal reflux disease)    Mental disorder    Panic attacks    Racing heart beat     Patient Active Problem List   Diagnosis Date Noted   Encounter for IUD insertion 11/13/2020    History reviewed. No pertinent surgical history.  OB History     Gravida  2   Para  2   Term  2   Preterm  0   AB  0   Living  2      SAB  0   IAB  0   Ectopic  0   Multiple  0   Live Births  2            Home Medications    Prior to Admission medications   Medication Sig Start Date End Date Taking? Authorizing Provider  amoxicillin-clavulanate (AUGMENTIN) 875-125 MG tablet Take 1 tablet by mouth every 12 (twelve) hours. 12/18/22  Yes Volney American, PA-C  ALPRAZolam [name]) 0.5 MG tablet Take 0.5 mg by mouth at bedtime as needed for anxiety.    [provider]  benzonatate (TESSALON) 100 MG capsule Take 1 capsule (100 mg total) by mouth 3 (three) times daily as needed for cough. Do not take with alcohol or while driving or operating heavy machinery.  May cause drowsiness. 10/01/22   Eulogio Bear, NP  citalopram (CELEXA) 40 MG tablet Take 40 mg by mouth daily.    [provider]  levonorgestrel (MIRENA) 20 MCG/DAY IUD 1 each by Intrauterine route once.    [provider]  metoprolol succinate (TOPROL-XL) 25 MG 24 hr tablet Take 25 mg by mouth daily as needed.    [provider]  omeprazole (PRILOSEC) 40 MG capsule Take 40 mg by mouth daily.    [provider]  ondansetron (ZOFRAN-ODT) 8 MG disintegrating tablet Take 1 tablet (8 mg total) by mouth every 8 (eight) hours as needed for nausea or vomiting. 06/09/22   Jaynee Eagles, PA-C  promethazine-dextromethorphan (PROMETHAZINE-DM) 6.25-15 MG/5ML syrup Take 5 mLs by mouth at bedtime as needed for cough. Do not take with alcohol or while driving or operating heavy machinery.  May cause drowsiness. 10/01/22   Eulogio Bear, NP    Family History Family History  Problem Relation Age of Onset   Heart disease Maternal Grandfather    Diabetes Father    Fibromyalgia Mother     Social History Social History   Tobacco Use   Smoking status: Never   Smokeless tobacco: Never  Vaping Use   Vaping Use: Never used  Substance Use Topics   Alcohol use: Yes    Alcohol/week: 1.0 standard drink of alcohol    Types: 1 Glasses of wine per week    Comment: couple times weekly  Drug use: Never     Allergies   Patient has no known allergies.   Review of Systems Review of Systems Per HPI  Physical Exam Triage Vital Signs ED Triage Vitals  Enc Vitals Group     BP 12/18/22 1210 108/75     Pulse Rate 12/18/22 1210 80     Resp 12/18/22 1210 18     Temp 12/18/22 1210 98.1 F (36.7 C)     Temp Source 12/18/22 1210 Oral     SpO2 12/18/22 1210 97 %     Weight --      Height --      Head Circumference --      Peak Flow --      Pain Score 12/18/22 1213 4     Pain Loc --      Pain Edu? --      Excl. in [place]? --    No data found.  Updated Vital Signs BP 108/75 (BP Location: Right Arm)   Pulse 80   Temp 98.1 F (36.7 C) (Oral)   Resp 18   LMP 10/30/2022 Comment: due to iud pt spots every couple of months  SpO2 97%   Visual Acuity Right Eye Distance:   Left Eye  Distance:   Bilateral Distance:    Right Eye Near:   Left Eye Near:    Bilateral Near:     Physical Exam Vitals and nursing note reviewed.  Constitutional:      Appearance: Normal appearance.  HENT:     Head: Atraumatic.     Right Ear: Tympanic membrane and external ear normal.     Left Ear: Tympanic membrane and external ear normal.     Nose: Congestion present.     Mouth/Throat:     Mouth: Mucous membranes are moist.     Pharynx: Posterior oropharyngeal erythema present.  Eyes:     Extraocular Movements: Extraocular movements intact.     Conjunctiva/sclera: Conjunctivae normal.  Cardiovascular:     Rate and Rhythm: Normal rate and regular rhythm.     Heart sounds: Normal heart sounds.  Pulmonary:     Effort: Pulmonary effort is normal.     Breath sounds: Normal breath sounds. No wheezing.  Musculoskeletal:        General: Normal range of motion.     Cervical back: Normal range of motion and neck supple.  Skin:    General: Skin is warm and dry.  Neurological:     Mental Status: She is alert and oriented to person, place, and time.  Psychiatric:        Mood and Affect: Mood normal.        Thought Content: Thought content normal.      UC Treatments / Results  Labs (all labs ordered are listed, but only abnormal results are displayed) Labs Reviewed  SARS CORONAVIRUS 2 (TAT 6-24 HRS)    EKG   Radiology No results found.  Procedures Procedures (including critical care time)  Medications Ordered in UC Medications - No data to display  Initial Impression / Assessment and Plan / UC Course  I have reviewed the triage vital signs and the nursing notes.  Pertinent labs & imaging results that were available during my care of the patient were reviewed by me and considered in my medical decision making (see chart for details).     Treat with Augmentin, Flonase, sinus rinses and supportive over-the-counter medications and home care.  She is requesting a COVID  test to be able to return back to work per their recommendation.  Work note given.  Final Clinical Impressions(s) / UC Diagnoses   Final diagnoses:  Acute non-recurrent maxillary sinusitis  Encounter for screening for COVID-19   Discharge Instructions   None    ED Prescriptions     Medication Sig Dispense Auth. Provider   amoxicillin-clavulanate (AUGMENTIN) 875-125 MG tablet Take 1 tablet by mouth every 12 (twelve) hours. 14 tablet Particia Nearing, [place]      PDMP not reviewed this encounter.   Particia Nearing, [place] 12/18/22 1402

## 2022-12-18 NOTE — ED Triage Notes (Signed)
Pt reports she has some nasal congestion and headache x 2 weeks.

## 2022-12-19 LAB — SARS CORONAVIRUS 2 (TAT 6-24 HRS): SARS Coronavirus 2: NEGATIVE

## 2023-01-07 ENCOUNTER — Encounter (HOSPITAL_COMMUNITY): Payer: Self-pay

## 2023-01-07 ENCOUNTER — Emergency Department (HOSPITAL_COMMUNITY): Payer: Self-pay

## 2023-01-07 ENCOUNTER — Other Ambulatory Visit: Payer: Self-pay

## 2023-01-07 ENCOUNTER — Emergency Department (HOSPITAL_COMMUNITY)
Admission: EM | Admit: 2023-01-07 | Discharge: 2023-01-08 | Disposition: A | Discharge status: Home / Self Care | Payer: Self-pay | Attending: Emergency Medicine | Admitting: Emergency Medicine

## 2023-01-07 DIAGNOSIS — M25472 Effusion, left ankle: Secondary | ICD-10-CM | POA: Insufficient documentation

## 2023-01-07 DIAGNOSIS — W010XXA Fall on same level from slipping, tripping and stumbling without subsequent striking against object, initial encounter: Secondary | ICD-10-CM | POA: Insufficient documentation

## 2023-01-07 DIAGNOSIS — S93402A Sprain of unspecified ligament of left ankle, initial encounter: Secondary | ICD-10-CM

## 2023-01-07 DIAGNOSIS — M25572 Pain in left ankle and joints of left foot: Secondary | ICD-10-CM | POA: Insufficient documentation

## 2023-01-07 DIAGNOSIS — Y92009 Unspecified place in unspecified non-institutional (private) residence as the place of occurrence of the external cause: Secondary | ICD-10-CM | POA: Insufficient documentation

## 2023-01-07 NOTE — ED Triage Notes (Signed)
Pov from home. [place] earlier today says her  left ankle rolled under her and now its a 9/10 pain. Unable to stand on ankle

## 2023-01-08 MED ORDER — HYDROMORPHONE HCL 1 MG/ML IJ SOLN
0.5000 mg | Freq: Once | INTRAMUSCULAR | Status: AC
  Administered 2023-01-08: 0.5 mg via INTRAMUSCULAR
  Filled 2023-01-08: qty 0.5

## 2023-01-08 NOTE — ED Provider Notes (Signed)
[facility] Provider Note   CSN: [id number] Arrival date & time: 01/07/23  2132     History  Chief Complaint  Patient presents with   Ankle Pain    left    Kerry Austin is a 37 y.o. female.  37 year old female who presents ER today for ankle pain after a fall.  She states she tripped in the garage and hurt her left ankle is now swollen and painful to walk.  No injuries elsewhere.  States that it hurt immediately but progressively got worse over the night.  No blood thinners.  No other injuries.   Ankle Pain      Home Medications Prior to Admission medications   Medication Sig Start Date End Date Taking? Authorizing Provider  ALPRAZolam [name]) 0.5 MG tablet Take 0.5 mg by mouth at bedtime as needed for anxiety.    [provider]  amoxicillin-clavulanate (AUGMENTIN) 875-125 MG tablet Take 1 tablet by mouth every 12 (twelve) hours. 12/18/22   Volney American, PA-C  benzonatate (TESSALON) 100 MG capsule Take 1 capsule (100 mg total) by mouth 3 (three) times daily as needed for cough. Do not take with alcohol or while driving or operating heavy machinery.  May cause drowsiness. 10/01/22   Eulogio Bear, NP  citalopram (CELEXA) 40 MG tablet Take 40 mg by mouth daily.    [provider]  levonorgestrel (MIRENA) 20 MCG/DAY IUD 1 each by Intrauterine route once.    [provider]  metoprolol succinate (TOPROL-XL) 25 MG 24 hr tablet Take 25 mg by mouth daily as needed.    [provider]  omeprazole (PRILOSEC) 40 MG capsule Take 40 mg by mouth daily.    [provider]  ondansetron (ZOFRAN-ODT) 8 MG disintegrating tablet Take 1 tablet (8 mg total) by mouth every 8 (eight) hours as needed for nausea or vomiting. 06/09/22   Jaynee Eagles, PA-C  promethazine-dextromethorphan (PROMETHAZINE-DM) 6.25-15 MG/5ML syrup Take 5 mLs by mouth at bedtime as needed for cough. Do not take with alcohol or  while driving or operating heavy machinery.  May cause drowsiness. 10/01/22   Eulogio Bear, NP      Allergies    Patient has no known allergies.    Review of Systems   Review of Systems  Physical Exam Updated Vital Signs BP 122/75 (BP Location: Right Arm)   Pulse 99   Temp 98.4 F (36.9 C) (Oral)   Resp 18   Ht 5\' 5"  (1.651 m)   Wt 85.3 kg   LMP 10/30/2022 Comment: due to iud pt spots every couple of months  SpO2 100%   BMI 31.28 kg/m  Physical Exam Vitals and nursing note reviewed.  Constitutional:      Appearance: She is well-developed.  HENT:     Head: Normocephalic and atraumatic.  Eyes:     Pupils: Pupils are equal, round, and reactive to light.  Cardiovascular:     Rate and Rhythm: Normal rate and regular rhythm.  Pulmonary:     Effort: No respiratory distress.     Breath sounds: No stridor.  Abdominal:     General: Abdomen is flat. There is no distension.  Musculoskeletal:        General: Swelling and tenderness (Left ankle.) present. Normal range of motion.     Cervical back: Normal range of motion.  Skin:    General: Skin is warm and dry.  Neurological:     General:  No focal deficit present.     Mental Status: She is alert.     ED Results / Procedures / Treatments   Labs (all labs ordered are listed, but only abnormal results are displayed) Labs Reviewed - No data to display  EKG None  Radiology DG Ankle Complete Left  Result Date: 01/07/2023 CLINICAL DATA:  Left ankle injury EXAM: LEFT ANKLE COMPLETE - 3+ VIEW COMPARISON:  None Available. FINDINGS: Frontal, oblique, and lateral views of the left ankle are obtained. No fracture, subluxation, or dislocation. Joint spaces are well preserved. Soft tissues are unremarkable. IMPRESSION: 1. Unremarkable left ankle. Electronically Signed   By: Randa Ngo M.D.   On: 01/07/2023 22:32    Procedures Procedures    Medications Ordered in ED Medications  HYDROmorphone (DILAUDID) injection 0.5 mg  (has no administration in time range)    ED Course/ Medical Decision Making/ A&P                             Medical Decision Making Amount and/or Complexity of Data Reviewed Radiology: ordered.  Risk Prescription drug management.   37 yo F who presents to ED with L ankle pain and swelling after fall with plantarflexion. On exam has pain with ROM, slight swelling, pain with palpation of malleolus. Difficulty bearing weight for more than a couple steps. So XR done to evaluate for fracture and negative. No proximal fibular ttp to suggest fracture or unstable injury. No laxity in ankle to suggest severe grade 3 sprain.  Plan for air splint, ace wrap. NSAIDs for pain. Will also employ RICE therapy and weight bearing as tolerated. If symptoms not improving in one week, will plan to follow up with PCP or orthopedics for repeat imaging to evaluate for occult fracture.   Final Clinical Impression(s) / ED Diagnoses Final diagnoses:  None    Rx / DC Orders ED Discharge Orders     None         Roselinda Bahena, Corene Cornea, MD 01/08/23 [phone]

## 2023-01-14 ENCOUNTER — Ambulatory Visit: Payer: Self-pay

## 2023-04-28 ENCOUNTER — Ambulatory Visit
Admission: RE | Admit: 2023-04-28 | Discharge: 2023-04-28 | Disposition: A | Discharge status: Home / Self Care | Payer: 59 | Source: Ambulatory Visit | Attending: Nurse Practitioner | Admitting: Nurse Practitioner

## 2023-04-28 ENCOUNTER — Ambulatory Visit (INDEPENDENT_AMBULATORY_CARE_PROVIDER_SITE_OTHER): Payer: 59

## 2023-04-28 VITALS — BP 108/73 | HR 71 | Temp 98.6°F | Resp 16

## 2023-04-28 DIAGNOSIS — S93401A Sprain of unspecified ligament of right ankle, initial encounter: Secondary | ICD-10-CM | POA: Diagnosis not present

## 2023-04-28 DIAGNOSIS — R600 Localized edema: Secondary | ICD-10-CM | POA: Diagnosis not present

## 2023-04-28 NOTE — ED Triage Notes (Signed)
Pt reports she rolled her right ankle going down the stairs  x 1 week. Pt tried ice and ibuprofen but no relief.  Pt has limited ROM in right ankle. Hurts to put pressure on it.

## 2023-04-28 NOTE — ED Provider Notes (Signed)
[facility]    CSN: [id number] Arrival date & time: 04/28/23  1643      History   Chief Complaint Chief Complaint  Patient presents with   Ankle Pain    Rolled ankle a week ago still having swelling and pain after elevating and icing it - Entered by patient    HPI Kerry Austin is a 37 y.o. female.   Patient presents today for right ankle pain.  Reports about 1 week ago, she was tripped by her dogs and rolled her ankle.  Initially, she rested, elevated the ankle, and applied ice and initially the ankle did improve.  She reports the past few days however the pain has begun to worsen.  She reports the ankle is significantly swollen and feels numb.  No numbness or tingling in the toes, fevers, nausea/vomiting.  She reports she can move the ankle, however it is painful.  The pain is worse with weightbearing.  No history of surgeries in the right ankle.      Past Medical History:  Diagnosis Date   Anxiety    GERD (gastroesophageal reflux disease)    Mental disorder    Panic attacks    Racing heart beat     Patient Active Problem List   Diagnosis Date Noted   Encounter for IUD insertion 11/13/2020    History reviewed. No pertinent surgical history.  OB History     Gravida  2   Para  2   Term  2   Preterm  0   AB  0   Living  2      SAB  0   IAB  0   Ectopic  0   Multiple  0   Live Births  2            Home Medications    Prior to Admission medications   Medication Sig Start Date End Date Taking? Authorizing Provider  ALPRAZolam Prudy Feeler) 0.5 MG tablet Take 0.5 mg by mouth at bedtime as needed for anxiety.    [provider]  citalopram (CELEXA) 40 MG tablet Take 40 mg by mouth daily.    [provider]  levonorgestrel (MIRENA) 20 MCG/DAY IUD 1 each by Intrauterine route once.    [provider]  metoprolol succinate (TOPROL-XL) 25 MG 24 hr tablet Take 25 mg by mouth daily as needed.    [provider]  omeprazole (PRILOSEC) 40 MG capsule Take 40 mg by mouth daily.    [provider]    Family History Family History  Problem Relation Age of Onset   Heart disease Maternal Grandfather    Diabetes Father    Fibromyalgia Mother     Social History Social History   Tobacco Use   Smoking status: Never   Smokeless tobacco: Never  Vaping Use   Vaping Use: Never used  Substance Use Topics   Alcohol use: Yes    Alcohol/week: 1.0 standard drink of alcohol    Types: 1 Glasses of wine per week    Comment: couple times weekly   Drug use: Never     Allergies   Patient has no known allergies.   Review of Systems Review of Systems Per HPI  Physical Exam Triage Vital Signs ED Triage Vitals [04/28/23 1726]  Enc Vitals Group     BP 108/73     Pulse Rate 71     Resp 16     Temp 98.6 F (37 C)  Temp Source Oral     SpO2 97 %     Weight      Height      Head Circumference      Peak Flow      Pain Score 6     Pain Loc      Pain Edu?      Excl. in GC?    No data found.  Updated Vital Signs BP 108/73 (BP Location: Right Arm)   Pulse 71   Temp 98.6 F (37 C) (Oral)   Resp 16   LMP 03/29/2023 (Approximate) Comment: spotting only  SpO2 97%   Visual Acuity Right Eye Distance:   Left Eye Distance:   Bilateral Distance:    Right Eye Near:   Left Eye Near:    Bilateral Near:     Physical Exam Vitals and nursing note reviewed.  Constitutional:      General: She is not in acute distress.    Appearance: Normal appearance. She is not toxic-appearing.  HENT:     Mouth/Throat:     Mouth: Mucous membranes are moist.     Pharynx: Oropharynx is clear.  Pulmonary:     Effort: Pulmonary effort is normal. No respiratory distress.  Musculoskeletal:     Comments: Inspection: Swelling and slight bruising to right lateral malleolus; no obvious deformity or redness Palpation: Right lateral malleolus tender to palpation diffusely; no obvious  deformities palpated ROM: Full ROM to ankle and flexibility of foot  Strength: 5/5 right lower extremity Neurovascular: neurovascularly intact in right lower extremity   Skin:    General: Skin is warm and dry.     Capillary Refill: Capillary refill takes less than 2 seconds.     Coloration: Skin is not jaundiced or pale.     Findings: No erythema.  Neurological:     Mental Status: She is alert and oriented to person, place, and time.  Psychiatric:        Behavior: Behavior is cooperative.      UC Treatments / Results  Labs (all labs ordered are listed, but only abnormal results are displayed) Labs Reviewed - No data to display  EKG   Radiology DG Ankle Complete Right  Result Date: 04/28/2023 CLINICAL DATA:  Fall down steps. Rolled ankle. Limited range of motion. Painful weight-bearing EXAM: RIGHT ANKLE - COMPLETE 3+ VIEW COMPARISON:  None Available. FINDINGS: There is no evidence of fracture, dislocation, or joint effusion. The ankle mortise is preserved. There is no evidence of arthropathy or other focal bone abnormality. Mild soft tissue edema laterally. IMPRESSION: Lateral soft tissue edema. No fracture or dislocation. Electronically Signed   By: Narda Rutherford M.D.   On: 04/28/2023 18:08    Procedures Procedures (including critical care time)  Medications Ordered in UC Medications - No data to display  Initial Impression / Assessment and Plan / UC Course  I have reviewed the triage vital signs and the nursing notes.  Pertinent labs & imaging results that were available during my care of the patient were reviewed by me and considered in my medical decision making (see chart for details).   Patient is well-appearing, normotensive, afebrile, not tachycardic, not tachypneic, oxygenating well on room air.    1. Sprain of right ankle, unspecified ligament, initial encounter Ankle x-ray today is negative for acute bony abnormality Suspect sprain of right ankle Recommended  rest, ice, compression, elevation Start cam boot as injury is 106 week old and pain/swelling has worsened Note given for  work Follow-up with orthopedic provider with no improvement in symptoms despite treatment  The patient was given the opportunity to ask questions.  All questions answered to their satisfaction.  The patient is in agreement to this plan.    Final Clinical Impressions(s) / UC Diagnoses   Final diagnoses:  Sprain of right ankle, unspecified ligament, initial encounter     Discharge Instructions      The ankle x-ray today does not show any broken bones.  You have likely sprained your ankle significantly.  Please wear the Ace wrap when you are up walking on your ankle as well as a cam boot.  Recommend resting your foot, elevation, ice, and compression.  You can take Tylenol 500 to 1000 mg as needed every 6 hours for pain.  Seek care for persistent or worsening symptoms despite treatment.    ED Prescriptions   None    PDMP not reviewed this encounter.   Valentino Nose, NP 04/28/23 1827

## 2023-04-28 NOTE — Discharge Instructions (Signed)
The ankle x-ray today does not show any broken bones.  You have likely sprained your ankle significantly.  Please wear the Ace wrap when you are up walking on your ankle as well as a cam boot.  Recommend resting your foot, elevation, ice, and compression.  You can take Tylenol 500 to 1000 mg as needed every 6 hours for pain.  Seek care for persistent or worsening symptoms despite treatment.

## 2023-05-12 ENCOUNTER — Ambulatory Visit: Payer: Self-pay

## 2023-05-18 ENCOUNTER — Ambulatory Visit
Admission: RE | Admit: 2023-05-18 | Discharge: 2023-05-18 | Disposition: A | Discharge status: Home / Self Care | Payer: 59 | Source: Ambulatory Visit

## 2023-05-18 VITALS — BP 110/66 | HR 74 | Temp 98.2°F | Resp 16

## 2023-05-18 DIAGNOSIS — S93401A Sprain of unspecified ligament of right ankle, initial encounter: Secondary | ICD-10-CM | POA: Diagnosis not present

## 2023-05-18 NOTE — ED Triage Notes (Signed)
Pt states she twisted her right ankle 3 weeks ago and had a x-ray here and states she is still having pain and discomfort when walking. Taking ibuprofen.

## 2023-05-18 NOTE — ED Provider Notes (Signed)
[facility]    CSN: [id number] Arrival date & time: 05/18/23  1351      History   Chief Complaint Chief Complaint  Patient presents with   Ankle Pain    Sprained a couple weeks ago was up on it more than normal this weekend having pain and discomfort while walking. - Entered by patient    HPI Kerry Austin is a 37 y.o. female.   The history is provided by the patient.   The patient presents for continued complaints of right ankle pain.  Patient states that she was sprained the right ankle approximately 3 to 4 weeks ago.  She was seen in this clinic, imaging was performed which was negative for fracture or dislocation.  Patient was diagnosed with a right ankle sprain.  Patient was also provided a cam boot at that time.  Patient states she subsequently followed up with her primary care physician who gave her a steroid injection in the right ankle.  Patient states she felt some relief from the steroid injection.  She states over the weekend, she began to resume her normal activities.  She states afterwards, the lateral aspect right ankle began to feel numb and painful.  Patient states that she does have increased pain with ambulating.  Patient states that her doctor did take her out of work, and she was supposed to return tomorrow.  She also states that she has followed up with [facility], she is awaiting an appointment at this time. Past Medical History:  Diagnosis Date   Anxiety    GERD (gastroesophageal reflux disease)    Mental disorder    Panic attacks    Racing heart beat     Patient Active Problem List   Diagnosis Date Noted   Encounter for IUD insertion 11/13/2020    History reviewed. No pertinent surgical history.  OB History     Gravida  2   Para  2   Term  2   Preterm  0   AB  0   Living  2      SAB  0   IAB  0   Ectopic  0   Multiple  0   Live Births  2            Home Medications    Prior to Admission  medications   Medication Sig Start Date End Date Taking? Authorizing Provider  ALPRAZolam Prudy Feeler) 0.5 MG tablet Take 0.5 mg by mouth at bedtime as needed for anxiety.   Yes [provider]  citalopram (CELEXA) 40 MG tablet Take 40 mg by mouth daily.   Yes [provider]  metoprolol succinate (TOPROL-XL) 25 MG 24 hr tablet Take 25 mg by mouth daily as needed.   Yes [provider]  omeprazole (PRILOSEC) 40 MG capsule Take 40 mg by mouth daily.   Yes [provider]  levonorgestrel (MIRENA) 20 MCG/DAY IUD 1 each by Intrauterine route once.    [provider]    Family History Family History  Problem Relation Age of Onset   Heart disease Maternal Grandfather    Diabetes Father    Fibromyalgia Mother     Social History Social History   Tobacco Use   Smoking status: Never   Smokeless tobacco: Never  Vaping Use   Vaping Use: Never used  Substance Use Topics   Alcohol use: Yes    Alcohol/week: 1.0 standard drink of alcohol    Types:  1 Glasses of wine per week    Comment: couple times weekly   Drug use: Never     Allergies   Patient has no known allergies.   Review of Systems Review of Systems Per HPI  Physical Exam Triage Vital Signs ED Triage Vitals  Enc Vitals Group     BP 05/18/23 1401 110/66     Pulse Rate 05/18/23 1401 74     Resp 05/18/23 1401 16     Temp 05/18/23 1401 98.2 F (36.8 C)     Temp Source 05/18/23 1401 Oral     SpO2 05/18/23 1401 95 %     Weight --      Height --      Head Circumference --      Peak Flow --      Pain Score 05/18/23 1402 4     Pain Loc --      Pain Edu? --      Excl. in GC? --    No data found.  Updated Vital Signs BP 110/66 (BP Location: Right Arm)   Pulse 74   Temp 98.2 F (36.8 C) (Oral)   Resp 16   LMP 04/12/2023 (Approximate) Comment: spotting only  SpO2 95%   Visual Acuity Right Eye Distance:   Left Eye Distance:   Bilateral Distance:    Right Eye Near:   Left  Eye Near:    Bilateral Near:     Physical Exam Vitals and nursing note reviewed.  Constitutional:      General: She is not in acute distress.    Appearance: Normal appearance.  Eyes:     Extraocular Movements: Extraocular movements intact.     Pupils: Pupils are equal, round, and reactive to light.  Pulmonary:     Effort: Pulmonary effort is normal.  Musculoskeletal:     Right ankle: No swelling, deformity or ecchymosis. Tenderness present over the lateral malleolus. Decreased range of motion (Pain noted with eversion). Normal pulse.  Skin:    General: Skin is warm and dry.  Neurological:     General: No focal deficit present.     Mental Status: She is alert and oriented to person, place, and time.  Psychiatric:        Mood and Affect: Mood normal.        Behavior: Behavior normal.      UC Treatments / Results  Labs (all labs ordered are listed, but only abnormal results are displayed) Labs Reviewed - No data to display  EKG   Radiology No results found.  Procedures Procedures (including critical care time)  Medications Ordered in UC Medications - No data to display  Initial Impression / Assessment and Plan / UC Course  I have reviewed the triage vital signs and the nursing notes.  Pertinent labs & imaging results that were available during my care of the patient were reviewed by me and considered in my medical decision making (see chart for details).  The patient is well-appearing, she is in no acute distress, vital signs are stable.  Patient with continued right ankle pain status post right ankle sprain approximately 3 to 4 weeks ago.  On exam, there is no obvious ecchymosis, swelling, erythema, or compromise vascular status.  She has good range of motion, but noted pain with eversion.  Previous imaging was negative for fracture or dislocation.  Shared decision making with the patient that another x-ray would not prove beneficial for her ongoing symptoms.  Cannot  rule  out ligamental injury given the patient's duration of symptoms with minimal improvement.  Patient is waiting to be scheduled with [facility].  In the interim, patient was advised to resume use of the cam walker boot, RICE therapy, and over-the-counter analgesics for pain or discomfort along with gentle range of motion exercises.  Patient was in agreement with this plan of care and verbalizes understanding.  All questions were answered.  Patient stable for discharge.  Work note was provided.   Final Clinical Impressions(s) / UC Diagnoses   Final diagnoses:  Sprain of right ankle, unspecified ligament, initial encounter     Discharge Instructions      Resume wearing the cam boot as discussed. Continue ibuprofen or Tylenol as needed for pain or discomfort. Continue RICE therapy, rest, ice, compression, and elevation.  Apply ice for 20 minutes, remove for 1 hour, then repeat as needed. Make sure you are wearing shoes with good support and insoles while symptoms persist.  It may be helpful for you to wear hightop shoes to provide support to the ankle. I have provided exercises for you to perform at home while symptoms persist.  Try to perform the exercises at least 2-3 times per day. Continue to follow-up with [facility] for an appointment. Follow-up as needed.     ED Prescriptions   None    PDMP not reviewed this encounter.   Abran Cantor, NP 05/18/23 1441

## 2023-05-18 NOTE — Discharge Instructions (Signed)
Resume wearing the cam boot as discussed. Continue ibuprofen or Tylenol as needed for pain or discomfort. Continue RICE therapy, rest, ice, compression, and elevation.  Apply ice for 20 minutes, remove for 1 hour, then repeat as needed. Make sure you are wearing shoes with good support and insoles while symptoms persist.  It may be helpful for you to wear hightop shoes to provide support to the ankle. I have provided exercises for you to perform at home while symptoms persist.  Try to perform the exercises at least 2-3 times per day. Continue to follow-up with [facility] for an appointment. Follow-up as needed.

## 2023-05-28 ENCOUNTER — Ambulatory Visit: Payer: 59 | Admitting: Orthopedic Surgery

## 2024-03-14 ENCOUNTER — Ambulatory Visit
Admission: RE | Admit: 2024-03-14 | Discharge: 2024-03-14 | Disposition: A | Discharge status: Home / Self Care | Payer: Self-pay | Source: Ambulatory Visit | Attending: Family Medicine | Admitting: Family Medicine

## 2024-03-14 VITALS — BP 122/81 | HR 95 | Temp 98.7°F | Resp 16

## 2024-03-14 DIAGNOSIS — S81812A Laceration without foreign body, left lower leg, initial encounter: Secondary | ICD-10-CM

## 2024-03-14 DIAGNOSIS — W540XXA Bitten by dog, initial encounter: Secondary | ICD-10-CM

## 2024-03-14 DIAGNOSIS — M79605 Pain in left leg: Secondary | ICD-10-CM

## 2024-03-14 MED ORDER — MUPIROCIN 2 % EX OINT: 1.0000 | TOPICAL_OINTMENT | Freq: Two times a day (BID) | CUTANEOUS | 0 refills | Status: AC

## 2024-03-14 MED ORDER — AMOXICILLIN-POT CLAVULANATE 875-125 MG PO TABS: 1.0000 | ORAL_TABLET | Freq: Two times a day (BID) | ORAL | 0 refills | Status: AC

## 2024-03-14 MED ORDER — CHLORHEXIDINE GLUCONATE 4 % EX SOLN: Freq: Every day | CUTANEOUS | 0 refills | Status: AC | PRN

## 2024-03-14 NOTE — ED Triage Notes (Signed)
 Pt reports dog bite  to her left leg. she was letting her dogs out when they started squabbling and one of them snapped at her left leg

## 2024-03-14 NOTE — Discharge Instructions (Addendum)
 Clean the area at least once a day with Hibiclens and apply mupirocin and a nonstick dressing.

## 2024-03-14 NOTE — ED Provider Notes (Signed)
 [facility]    CSN: [id number] Arrival date & time: 03/14/24  1234      History   Chief Complaint Chief Complaint  Patient presents with   Leg Injury    Dog bite - Entered by patient    HPI Kerry Austin is a 38 y.o. female.   Patient presenting today with multiple superficial lacerations to the left lower leg that occurred this morning when her personal dogs started fighting with each other.  She states they are both up-to-date on their vaccinations and she believes she is up-to-date on her tetanus shot.  She does not recall exactly what year she last got her tetanus shot.  She has so far not tried anything over-the-counter for symptoms.  Has been keeping pressure on the wounds with good result.  Not currently on any anticoagulation.    Past Medical History:  Diagnosis Date   Anxiety    GERD (gastroesophageal reflux disease)    Mental disorder    Panic attacks    Racing heart beat     Patient Active Problem List   Diagnosis Date Noted   Encounter for IUD insertion 11/13/2020    History reviewed. No pertinent surgical history.  OB History     Gravida  2   Para  2   Term  2   Preterm  0   AB  0   Living  2      SAB  0   IAB  0   Ectopic  0   Multiple  0   Live Births  2            Home Medications    Prior to Admission medications   Medication Sig Start Date End Date Taking? Authorizing Provider  amoxicillin-clavulanate (AUGMENTIN) 875-125 MG tablet Take 1 tablet by mouth every 12 (twelve) hours. 03/14/24  Yes Corbin Dess, PA-C  chlorhexidine (HIBICLENS) 4 % external liquid Apply topically daily as needed. 03/14/24  Yes Corbin Dess, PA-C  mupirocin ointment (BACTROBAN) 2 % Apply 1 Application topically 2 (two) times daily. 03/14/24  Yes Corbin Dess, PA-C  ALPRAZolam (XANAX) 0.5 MG tablet Take 0.5 mg by mouth at bedtime as needed for anxiety.    [provider]  citalopram (CELEXA) 40 MG  tablet Take 40 mg by mouth daily.    [provider]  levonorgestrel (MIRENA) 20 MCG/DAY IUD 1 each by Intrauterine route once.    [provider]  metoprolol succinate (TOPROL-XL) 25 MG 24 hr tablet Take 25 mg by mouth daily as needed.    [provider]  omeprazole (PRILOSEC) 40 MG capsule Take 40 mg by mouth daily.    [provider]    Family History Family History  Problem Relation Age of Onset   Heart disease Maternal Grandfather    Diabetes Father    Fibromyalgia Mother     Social History Social History   Tobacco Use   Smoking status: Never   Smokeless tobacco: Never  Vaping Use   Vaping status: Never Used  Substance Use Topics   Alcohol use: Yes    Alcohol/week: 1.0 standard drink of alcohol    Types: 1 Glasses of wine per week    Comment: couple times weekly   Drug use: Never     Allergies   Patient has no known allergies.   Review of Systems Review of Systems Per HPI  Physical Exam Triage Vital Signs ED Triage Vitals  Encounter  Vitals Group     BP 03/14/24 1309 122/81     Systolic BP Percentile --      Diastolic BP Percentile --      Pulse Rate 03/14/24 1309 95     Resp 03/14/24 1309 16     Temp 03/14/24 1309 98.7 F (37.1 C)     Temp Source 03/14/24 1309 Oral     SpO2 03/14/24 1309 97 %     Weight --      Height --      Head Circumference --      Peak Flow --      Pain Score 03/14/24 1312 3     Pain Loc --      Pain Education --      Exclude from Growth Chart --    No data found.  Updated Vital Signs BP 122/81 (BP Location: Right Arm)   Pulse 95   Temp 98.7 F (37.1 C) (Oral)   Resp 16   SpO2 97%   Visual Acuity Right Eye Distance:   Left Eye Distance:   Bilateral Distance:    Right Eye Near:   Left Eye Near:    Bilateral Near:     Physical Exam Vitals and nursing note reviewed.  Constitutional:      Appearance: Normal appearance. She is not ill-appearing.  HENT:     Head: Atraumatic.   Eyes:     Extraocular Movements: Extraocular movements intact.     Conjunctiva/sclera: Conjunctivae normal.  Cardiovascular:     Rate and Rhythm: Normal rate.  Pulmonary:     Effort: Pulmonary effort is normal.  Musculoskeletal:        General: Normal range of motion.     Cervical back: Normal range of motion and neck supple.  Skin:    General: Skin is warm.     Comments: 3 superficial abrasions/lacerations to the left lower leg, bleeding well-controlled.  No foreign body appreciable  Neurological:     Mental Status: She is alert and oriented to person, place, and time.     Comments: Left lower extremity neurovascularly intact  Psychiatric:        Mood and Affect: Mood normal.        Thought Content: Thought content normal.        Judgment: Judgment normal.      UC Treatments / Results  Labs (all labs ordered are listed, but only abnormal results are displayed) Labs Reviewed - No data to display  EKG   Radiology No results found.  Procedures Procedures (including critical care time)  Medications Ordered in UC Medications - No data to display  Initial Impression / Assessment and Plan / UC Course  I have reviewed the triage vital signs and the nursing notes.  Pertinent labs & imaging results that were available during my care of the patient were reviewed by me and considered in my medical decision making (see chart for details).     Up-to-date on tetanus per patient, declines renewing this today.  Wounds cleaned and dressed today, will treat with Augmentin, mupirocin, Hibiclens and discussed home wound care and return precautions.  Work note given.  Final Clinical Impressions(s) / UC Diagnoses   Final diagnoses:  Pain of left lower extremity  Dog bite, initial encounter  Laceration of left lower extremity, initial encounter     Discharge Instructions      Clean the area at least once a day with Hibiclens and apply mupirocin and a  nonstick  dressing.    ED Prescriptions     Medication Sig Dispense Auth. Provider   amoxicillin-clavulanate (AUGMENTIN) 875-125 MG tablet Take 1 tablet by mouth every 12 (twelve) hours. 14 tablet Corbin Dess, PA-C   mupirocin ointment (BACTROBAN) 2 % Apply 1 Application topically 2 (two) times daily. 22 g Corbin Dess, PA-C   chlorhexidine (HIBICLENS) 4 % external liquid Apply topically daily as needed. 236 mL Corbin Dess, [place]      PDMP not reviewed this encounter.   Corbin Dess, [place] 03/14/24 1422

## 2024-09-12 ENCOUNTER — Ambulatory Visit
Admission: RE | Admit: 2024-09-12 | Discharge: 2024-09-12 | Disposition: A | Discharge status: Home / Self Care | Payer: Self-pay | Attending: Family Medicine | Admitting: Family Medicine

## 2024-09-12 VITALS — BP 138/84 | HR 91 | Temp 98.5°F | Resp 20

## 2024-09-12 DIAGNOSIS — U071 COVID-19: Secondary | ICD-10-CM

## 2024-09-12 MED ORDER — PROMETHAZINE-DM 6.25-15 MG/5ML PO SYRP: 5.0000 mL | ORAL_SOLUTION | Freq: Four times a day (QID) | ORAL | 0 refills | Status: AC | PRN

## 2024-09-12 MED ORDER — LIDOCAINE VISCOUS HCL 2 % MT SOLN: 10.0000 mL | OROMUCOSAL | 0 refills | Status: AC | PRN

## 2024-09-12 MED ORDER — PREDNISONE 20 MG PO TABS: 40.0000 mg | ORAL_TABLET | Freq: Every day | ORAL | 0 refills | Status: AC

## 2024-09-12 NOTE — ED Triage Notes (Signed)
 Pt reports sore throat, tested positive for COVID last Wednesday. Swallowing feels like a lump, tender to the touch.

## 2024-09-12 NOTE — Discharge Instructions (Signed)
 I have prescribed some medications to hopefully help with your current viral symptoms.  You may continue over-the-counter cold and congestion medications and supportive remedies additionally.  Follow-up for worsening or unresolving symptoms.

## 2024-09-12 NOTE — ED Provider Notes (Signed)
 [facility]    CSN: [id number] Arrival date & time: 09/12/24  1403      History   Chief Complaint Chief Complaint  Patient presents with   Sore Throat    Tested positive for Covid last Wednesday not sure if it's from coughing but it's way worse than last week - Entered by patient    HPI Kerry Austin is a 38 y.o. female.   Presenting today with progressively worsening sore swollen feeling throat, cough, congestion for the past 5 to 7 days.  Tested positive for COVID last Wednesday on home test.  Denies fever, chest pain, shortness of breath, abdominal pain, vomiting, diarrhea.  Trying over-the-counter cold and congestion medication with mild temporary benefit.    Past Medical History:  Diagnosis Date   Anxiety    GERD (gastroesophageal reflux disease)    Mental disorder    Panic attacks    Racing heart beat     Patient Active Problem List   Diagnosis Date Noted   Encounter for IUD insertion 11/13/2020    History reviewed. No pertinent surgical history.  OB History     Gravida  2   Para  2   Term  2   Preterm  0   AB  0   Living  2      SAB  0   IAB  0   Ectopic  0   Multiple  0   Live Births  2            Home Medications    Prior to Admission medications   Medication Sig Start Date End Date Taking? Authorizing Provider  lidocaine (XYLOCAINE) 2 % solution Use as directed 10 mLs in the mouth or throat every 3 (three) hours as needed. 09/12/24  Yes Stuart Vernell Norris, PA-C  predniSONE  (DELTASONE ) 20 MG tablet Take 2 tablets (40 mg total) by mouth daily with breakfast. 09/12/24  Yes Stuart Vernell Norris, PA-C  promethazine -dextromethorphan (PROMETHAZINE -DM) 6.25-15 MG/5ML syrup Take 5 mLs by mouth 4 (four) times daily as needed. 09/12/24  Yes Stuart Vernell Norris, PA-C  ALPRAZolam (XANAX) 0.5 MG tablet Take 0.5 mg by mouth at bedtime as needed for anxiety.    [provider]  amoxicillin -clavulanate  (AUGMENTIN ) 875-125 MG tablet Take 1 tablet by mouth every 12 (twelve) hours. 03/14/24   Stuart Vernell Norris, PA-C  chlorhexidine  (HIBICLENS ) 4 % external liquid Apply topically daily as needed. 03/14/24   Stuart Vernell Norris, PA-C  citalopram (CELEXA) 40 MG tablet Take 40 mg by mouth daily.    [provider]  levonorgestrel  (MIRENA ) 20 MCG/DAY IUD 1 each by Intrauterine route once.    [provider]  metoprolol succinate (TOPROL-XL) 25 MG 24 hr tablet Take 25 mg by mouth daily as needed.    [provider]  mupirocin  ointment (BACTROBAN ) 2 % Apply 1 Application topically 2 (two) times daily. 03/14/24   Stuart Vernell Norris, PA-C  omeprazole (PRILOSEC) 40 MG capsule Take 40 mg by mouth daily.    [provider]    Family History Family History  Problem Relation Age of Onset   Heart disease Maternal Grandfather    Diabetes Father    Fibromyalgia Mother     Social History Social History   Tobacco Use   Smoking status: Never   Smokeless tobacco: Never  Vaping Use   Vaping status: Never Used  Substance Use Topics   Alcohol use: Yes    Alcohol/week: 1.0 standard  drink of alcohol    Types: 1 Glasses of wine per week    Comment: couple times weekly   Drug use: Never     Allergies   Patient has no known allergies.   Review of Systems Review of Systems Per HPI  Physical Exam Triage Vital Signs ED Triage Vitals  Encounter Vitals Group     BP 09/12/24 1423 138/84     Girls Systolic BP Percentile --      Girls Diastolic BP Percentile --      Boys Systolic BP Percentile --      Boys Diastolic BP Percentile --      Pulse Rate 09/12/24 1423 91     Resp 09/12/24 1423 20     Temp 09/12/24 1423 98.5 F (36.9 C)     Temp Source 09/12/24 1423 Oral     SpO2 09/12/24 1423 97 %     Weight --      Height --      Head Circumference --      Peak Flow --      Pain Score 09/12/24 1425 0     Pain Loc --      Pain Education --      Exclude  from Growth Chart --    No data found.  Updated Vital Signs BP 138/84 (BP Location: Right Arm)   Pulse 91   Temp 98.5 F (36.9 C) (Oral)   Resp 20   SpO2 97%   Visual Acuity Right Eye Distance:   Left Eye Distance:   Bilateral Distance:    Right Eye Near:   Left Eye Near:    Bilateral Near:     Physical Exam Vitals and nursing note reviewed.  Constitutional:      Appearance: Normal appearance.  HENT:     Head: Atraumatic.     Right Ear: Tympanic membrane and external ear normal.     Left Ear: Tympanic membrane and external ear normal.     Nose: Rhinorrhea present.     Mouth/Throat:     Mouth: Mucous membranes are moist.     Pharynx: Posterior oropharyngeal erythema present. No oropharyngeal exudate.  Eyes:     Extraocular Movements: Extraocular movements intact.     Conjunctiva/sclera: Conjunctivae normal.  Cardiovascular:     Rate and Rhythm: Normal rate and regular rhythm.     Heart sounds: Normal heart sounds.  Pulmonary:     Effort: Pulmonary effort is normal.     Breath sounds: Normal breath sounds. No wheezing or rales.  Musculoskeletal:        General: Normal range of motion.     Cervical back: Normal range of motion and neck supple.  Lymphadenopathy:     Cervical: No cervical adenopathy.  Skin:    General: Skin is warm and dry.  Neurological:     Mental Status: She is alert and oriented to person, place, and time.  Psychiatric:        Mood and Affect: Mood normal.        Thought Content: Thought content normal.      UC Treatments / Results  Labs (all labs ordered are listed, but only abnormal results are displayed) Labs Reviewed - No data to display  EKG   Radiology No results found.  Procedures Procedures (including critical care time)  Medications Ordered in UC Medications - No data to display  Initial Impression / Assessment and Plan / UC Course  I have reviewed the  triage vital signs and the nursing notes.  Pertinent labs &  imaging results that were available during my care of the patient were reviewed by me and considered in my medical decision making (see chart for details).     Vital signs and exam very reassuring, suspect COVID related symptoms.  Given duration worsening course will try prednisone , viscous lidocaine, Phenergan  DM, supportive over-the-counter medications and home care.  Return for worsening symptoms.  Final Clinical Impressions(s) / UC Diagnoses   Final diagnoses:  COVID-19     Discharge Instructions      I have prescribed some medications to hopefully help with your current viral symptoms.  You may continue over-the-counter cold and congestion medications and supportive remedies additionally.  Follow-up for worsening or unresolving symptoms.    ED Prescriptions     Medication Sig Dispense Auth. Provider   promethazine -dextromethorphan (PROMETHAZINE -DM) 6.25-15 MG/5ML syrup Take 5 mLs by mouth 4 (four) times daily as needed. 100 mL Jett Kulzer Elizabeth, PA-C   lidocaine (XYLOCAINE) 2 % solution Use as directed 10 mLs in the mouth or throat every 3 (three) hours as needed. 100 mL Stuart Vernell Norris, PA-C   predniSONE  (DELTASONE ) 20 MG tablet Take 2 tablets (40 mg total) by mouth daily with breakfast. 10 tablet Stuart Vernell Norris, [place]      PDMP not reviewed this encounter.   Stuart Vernell Norris, PA-C 09/12/24 1513

## 2024-10-31 DIAGNOSIS — E66811 Obesity, class 1: Secondary | ICD-10-CM | POA: Diagnosis not present

## 2024-10-31 DIAGNOSIS — Z6833 Body mass index (BMI) 33.0-33.9, adult: Secondary | ICD-10-CM | POA: Diagnosis not present

## 2024-10-31 DIAGNOSIS — F411 Generalized anxiety disorder: Secondary | ICD-10-CM | POA: Diagnosis not present

## 2024-10-31 DIAGNOSIS — E6609 Other obesity due to excess calories: Secondary | ICD-10-CM | POA: Diagnosis not present

## 2024-11-01 ENCOUNTER — Ambulatory Visit (HOSPITAL_BASED_OUTPATIENT_CLINIC_OR_DEPARTMENT_OTHER): Payer: Self-pay | Admitting: Family Medicine

## 2024-11-17 ENCOUNTER — Ambulatory Visit (HOSPITAL_BASED_OUTPATIENT_CLINIC_OR_DEPARTMENT_OTHER): Payer: Self-pay | Admitting: Family Medicine
